# Patient Record
Sex: Male | Born: 1940 | Race: White | Hispanic: No | Marital: Married | Smoking: Never smoker
Health system: Southern US, Community
[De-identification: ages and names within clinical notes are randomized; demographics above are authoritative.]

## PROBLEM LIST (undated history)

## (undated) DIAGNOSIS — C61 Malignant neoplasm of prostate: Secondary | ICD-10-CM

## (undated) DIAGNOSIS — B029 Zoster without complications: Secondary | ICD-10-CM

## (undated) DIAGNOSIS — N419 Inflammatory disease of prostate, unspecified: Secondary | ICD-10-CM

## (undated) DIAGNOSIS — Z950 Presence of cardiac pacemaker: Secondary | ICD-10-CM

## (undated) DIAGNOSIS — G4730 Sleep apnea, unspecified: Secondary | ICD-10-CM

## (undated) DIAGNOSIS — E7800 Pure hypercholesterolemia, unspecified: Secondary | ICD-10-CM

## (undated) DIAGNOSIS — B54 Unspecified malaria: Secondary | ICD-10-CM

## (undated) DIAGNOSIS — I10 Essential (primary) hypertension: Secondary | ICD-10-CM

## (undated) HISTORY — PX: PACEMAKER INSERTION: SHX728

## (undated) HISTORY — PX: OTHER SURGICAL HISTORY: SHX169

---

## 2004-01-27 ENCOUNTER — Ambulatory Visit: Payer: Self-pay | Admitting: Family Medicine

## 2005-02-12 DIAGNOSIS — B54 Unspecified malaria: Secondary | ICD-10-CM

## 2005-02-12 HISTORY — DX: Unspecified malaria: B54

## 2005-10-15 DIAGNOSIS — Z8613 Personal history of malaria: Secondary | ICD-10-CM | POA: Insufficient documentation

## 2005-11-01 ENCOUNTER — Inpatient Hospital Stay: Payer: Self-pay | Admitting: Internal Medicine

## 2005-11-01 ENCOUNTER — Ambulatory Visit: Payer: Self-pay | Admitting: Family Medicine

## 2006-02-12 DIAGNOSIS — B029 Zoster without complications: Secondary | ICD-10-CM

## 2006-02-12 HISTORY — DX: Zoster without complications: B02.9

## 2006-05-31 ENCOUNTER — Other Ambulatory Visit: Payer: Self-pay

## 2006-06-01 ENCOUNTER — Inpatient Hospital Stay: Payer: Self-pay | Admitting: Internal Medicine

## 2007-04-08 ENCOUNTER — Ambulatory Visit: Payer: Self-pay | Admitting: Gastroenterology

## 2008-05-02 ENCOUNTER — Emergency Department: Payer: Self-pay | Admitting: Emergency Medicine

## 2010-03-15 ENCOUNTER — Ambulatory Visit: Payer: Self-pay | Admitting: Family Medicine

## 2010-09-06 ENCOUNTER — Other Ambulatory Visit: Payer: Self-pay | Admitting: Unknown Physician Specialty

## 2010-11-17 ENCOUNTER — Ambulatory Visit: Payer: Self-pay | Admitting: Unknown Physician Specialty

## 2011-03-29 DIAGNOSIS — I951 Orthostatic hypotension: Secondary | ICD-10-CM | POA: Insufficient documentation

## 2011-03-29 DIAGNOSIS — I2510 Atherosclerotic heart disease of native coronary artery without angina pectoris: Secondary | ICD-10-CM | POA: Insufficient documentation

## 2012-02-21 DIAGNOSIS — R0681 Apnea, not elsewhere classified: Secondary | ICD-10-CM | POA: Insufficient documentation

## 2013-12-28 ENCOUNTER — Ambulatory Visit: Payer: Self-pay | Admitting: Gastroenterology

## 2014-03-31 ENCOUNTER — Telehealth: Payer: Self-pay | Admitting: Medical Oncology

## 2014-03-31 NOTE — Telephone Encounter (Signed)
I called pt to introduce myself as the Prostate Nurse Navigator and the Coordinator of the Prostate [place].  1. I confirmed with the patient he is aware of his referral to the clinic. He stated that Dr. Alinda Money called him last night to discuss. We confirmed his appointment for 2/23 to arrive at 12:30pm.  2. I discussed the format of the clinic and the physicians he will be seeing that day. I encouraged him to bring his wife and any family members. He states his son is an ER physician in [place] and will be coming.  3. I discussed where the clinic is located, the [place] parking  and how to contact me.  4. I confirmed his address and informed him I would be mailing a packet of information and forms to be completed. I asked him to bring them with him the day of his appointment.   He voiced understanding of the above. I asked him to call me if he has any questions or concerns regarding his appointments or the forms he needs to complete.  Cira Rue, RN, BSN, [place]  [phone]  Fax [phone]

## 2014-04-05 ENCOUNTER — Telehealth: Payer: Self-pay | Admitting: Medical Oncology

## 2014-04-05 NOTE — Progress Notes (Addendum)
GU Location of Tumor / Histology: Prostatic adenocarcinoma  If Prostate Cancer, Gleason Score is (3 +4+7) and PSA is (10.16)  Wesley Gonzalez presented 4-8 years ago with signs/symptoms of a long history of elevated PSA.  Has undergone four prostate biopsies which have confirmed chronic inflammation but no malignancy.  He has developed post-biopsy sepsis twice.  Baseline IPSS: 15.  Reported erectile dysfunction.    Biopsies of prostate (if applicable) revealed: 03/00/9233:   Past/Anticipated interventions by urology, if any: currently on Finasteride 5mg   Past/Anticipated interventions by medical oncology, if any:   Weight changes, if any:   Bowel/Bladder complaints, if any:   Nausea/Vomiting, if any:   Pain issues, if any:    SAFETY ISSUES:  Prior radiation? no  Pacemaker/ICD? yes  Possible current pregnancy? no  Is the patient on methotrexate? no   Current Complaints / other details:

## 2014-04-05 NOTE — Telephone Encounter (Signed)
I called Mr. Smelser to confirm his appointment for [facility] 04/06/14 arriving at 12:30. He was in a meeting but I spoke with his wife. Pt did receive his packet of information and I asked her to have him bring tomorrow. We reviewed where the cancer center is located and about our valet services. I asked her to call me with any questions of concerns. She voiced understanding.    Cira Rue, RN, BSN, [place]  [phone] Fax [phone]

## 2014-04-06 ENCOUNTER — Encounter: Payer: Self-pay | Admitting: Medical Oncology

## 2014-04-06 ENCOUNTER — Ambulatory Visit (HOSPITAL_BASED_OUTPATIENT_CLINIC_OR_DEPARTMENT_OTHER): Payer: Medicare Other | Admitting: Oncology

## 2014-04-06 ENCOUNTER — Ambulatory Visit
Admission: RE | Admit: 2014-04-06 | Discharge: 2014-04-06 | Disposition: A | Discharge status: Home / Self Care | Payer: Medicare Other | Source: Ambulatory Visit | Attending: Radiation Oncology | Admitting: Radiation Oncology

## 2014-04-06 VITALS — BP 140/62 | HR 86 | Temp 97.9°F | Ht 69.0 in | Wt 220.0 lb

## 2014-04-06 DIAGNOSIS — E785 Hyperlipidemia, unspecified: Secondary | ICD-10-CM | POA: Diagnosis not present

## 2014-04-06 DIAGNOSIS — N529 Male erectile dysfunction, unspecified: Secondary | ICD-10-CM | POA: Insufficient documentation

## 2014-04-06 DIAGNOSIS — Z9049 Acquired absence of other specified parts of digestive tract: Secondary | ICD-10-CM | POA: Insufficient documentation

## 2014-04-06 DIAGNOSIS — R351 Nocturia: Secondary | ICD-10-CM

## 2014-04-06 DIAGNOSIS — Z7982 Long term (current) use of aspirin: Secondary | ICD-10-CM | POA: Insufficient documentation

## 2014-04-06 DIAGNOSIS — Z950 Presence of cardiac pacemaker: Secondary | ICD-10-CM | POA: Insufficient documentation

## 2014-04-06 DIAGNOSIS — I499 Cardiac arrhythmia, unspecified: Secondary | ICD-10-CM | POA: Diagnosis not present

## 2014-04-06 DIAGNOSIS — C61 Malignant neoplasm of prostate: Secondary | ICD-10-CM

## 2014-04-06 DIAGNOSIS — Z87438 Personal history of other diseases of male genital organs: Secondary | ICD-10-CM | POA: Insufficient documentation

## 2014-04-06 DIAGNOSIS — I10 Essential (primary) hypertension: Secondary | ICD-10-CM | POA: Insufficient documentation

## 2014-04-06 HISTORY — DX: Essential (primary) hypertension: I10

## 2014-04-06 HISTORY — DX: Pure hypercholesterolemia, unspecified: E78.00

## 2014-04-06 HISTORY — DX: Unspecified malaria: B54

## 2014-04-06 HISTORY — DX: Presence of cardiac pacemaker: Z95.0

## 2014-04-06 HISTORY — DX: Zoster without complications: B02.9

## 2014-04-06 HISTORY — DX: Inflammatory disease of prostate, unspecified: N41.9

## 2014-04-06 HISTORY — DX: Sleep apnea, unspecified: G47.30

## 2014-04-06 HISTORY — DX: Malignant neoplasm of prostate: C61

## 2014-04-06 NOTE — Addendum Note (Signed)
Encounter addended by: Raynelle Bring, MD on: 04/06/2014 10:36 PM<BR>     Documentation filed: Clinical Notes

## 2014-04-06 NOTE — Addendum Note (Signed)
Encounter addended by: Deirdre Evener, RN on: 04/06/2014  5:09 PM<BR>     Documentation filed: Charges VN

## 2014-04-06 NOTE — Addendum Note (Signed)
Encounter addended by: Gwenette Greet, RN on: 04/06/2014  5:01 PM<BR>     Documentation filed: Inpatient Document Flowsheet

## 2014-04-06 NOTE — Consult Note (Signed)
Reason for Referral: Prostate cancer  HPI: 74 year old gentleman currently of [place] where he resides. He has a history of hyperlipidemia, hypertension and cardiac pacemaker. He had a long history of elevated PSA for the last 10 years. He has had multiple biopsies in the past that were done revealing. His most recent PSA was up to 10.16 in October 2015. He had a repeat biopsy on 03/24/2014 which showed a prostate cancer Gleason score 3+4 = 7 in 6 cores. He does have history of BPH and has been on finasteride with an IPSS score of 15. He does report occasional nocturia but no hematuria or dysuria. Otherwise he is asymptomatic. He does have erectile dysfunction that has been chronic in nature. Otherwise he is asymptomatic and continues to be very active. He is planning a trip in the near future to [place] and continues to perform activities of daily living. He does not report any headaches or blurry vision or syncope. He does not report any chest pain or shortness of breath. He does not report any cough or hemoptysis. Rest of his review of systems unremarkable.   Past Medical History  Diagnosis Date  . Prostatitis   . Hypercholesteremia   . Hypertension   . Malaria 2007    history of  . Sleep apnea     Cpap  . Prostate cancer   . Shingles 2008  . Pacemaker   :  Past Surgical History  Procedure Laterality Date  . Pacemaker insertion    . Choleycystectomy    :   Current outpatient prescriptions:  .  aspirin 81 MG tablet, Take 81 mg by mouth daily., Disp: , Rfl:  .  finasteride (PROSCAR) 5 MG tablet, Take 5 mg by mouth daily., Disp: , Rfl:  .  losartan (COZAAR) 25 MG tablet, Take 25 mg by mouth daily., Disp: , Rfl:  .  Nutritional Supplements (JUICE PLUS FIBRE PO), Take 6 tablets by mouth daily., Disp: , Rfl:  .  Probiotic Product (SOLUBLE FIBER/PROBIOTICS PO), Take 1 tablet by mouth 1 day or 1 dose., Disp: , Rfl: :  Allergies  Allergen Reactions  . Ace Inhibitors     Fatigue and  Lightheadeness  . Other     Beta Blockers - Fatigue, Light headeness  . Statins     Fatigue and myalgia  :  Family History  Problem Relation Age of Onset  . Cancer Mother     Malignant Histocytoma  . Coronary artery disease Father   :  History   Social History  . Marital Status: Married    Spouse Name: N/A  . Number of Children: N/A  . Years of Education: N/A   Occupational History  . Minister    Social History Main Topics  . Smoking status: Never Smoker   . Smokeless tobacco: Not on file  . Alcohol Use: No  . Drug Use: No  . Sexual Activity: Not on file   Other Topics Concern  . Not on file   Social History Narrative  :  Pertinent items are noted in HPI.    Assessment and Plan:   74 year old gentleman with prostate cancer diagnosed in February 2016 after presenting with a PSA of 10.16 and a Gleason score 3+4 = 7. His case was discussed today and the prostate cancer multidisciplinary clinic. His biopsy was reviewed with the reviewing pathologist. Given his high PSA and high risk of metastatic disease, we have recommended staging workup including weight CT scan and a bone scan to  rule out metastatic disease. If he has organ confined disease, he would be an excellent candidate for external beam radiation with potentially 2 years of androgen deprivation therapy. The rationale for using androgen deprivation therapy was discussed with the patient and his family. I explained to him the duration is based on multiple clinical trials that have tested different durations ranging from 6 months to 24 months. It is reasonable to consider 16-24 months is the standard of care. Complications include fatigue, tiredness, weight gain, hot flashes, among others were discussed. I explained to him unless he has systemic disease, I see no role for systemic chemotherapy at this time. All his questions were answered to his satisfaction.

## 2014-04-06 NOTE — Consult Note (Signed)
Chief Complaint  Prostate Cancer   Reason For Visit  Reason for visit: To discuss treatment options for prostate cancer. PCP:  Dr. 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  His PMH is significant for sleep apnea, hypertension, hypercholesterolemia, and arrhythmia s/p cardiac pacemaker placement.  He has a long history of an elevated PSA and had undergone four prior prostate biopsies by Dr. Luanne Bras including biopsies in July 2005 (chronic inflammation) and July 2006 (chronic inflammation), and March 2010 (chronic inflammation).  His PSA has ranged between 4 and 8 over the years despite therapy with finasteride. I assumed his care in 2012 after Dr. Tommy Medal retirement.  His PSA fluctuated and I did perform a PCA -3 test in 2014 that was non-diagnostic.  His PSA further increased to 10.16 (free % of 5%) in October 2015.  He was unable to undergo an MRI of the prostate due to his pacemaker and therefore was recommended to undergo another prostate biopsy.  This was initially delayed due to his busy travel schedule but was finally performed on 03/23/14 and demonstrated 7 out of 12 biopsy cores positive for Gleason 3+4=7 adenocarcinoma of the prostate.   TNM stage: cT1c Nx Mx PSA: 10.16 (on 5 ARI) Gleason score: 3+4=7 Biopsy (03/23/14): 7/12 cores positive Prostate volume: 32.4 cc  Nomogram OC disease: 18% EPE: 80% SVI:9% LNI: 8% PFS (surgery): 64% at 5 years, 48% at 10 years  Urinary function: He does take finasteride.  IPSS is 12. Erectile function: SHIM score is 19.   Past Medical History  1. History of acute prostatitis (Z87.438)  2. History of cardiac pacemaker (Z95.0)  3. History of hypercholesterolemia (Z86.39)  4. History of hypertension (Z86.79)  5. History of sleep apnea (Z87.09)  6. History of Malaria  Surgical History  1. History of Cholecystectomy  2. History of Pacemaker Placement  Current Meds  1. Aspirin Low Strength 81 MG Oral Tablet Chewable;  Therapy: (Recorded:14Nov2007) to Recorded  2. Finasteride 5 MG Oral Tablet; TAKE 1 TABLET BY MOUTH ONCE A DAY;  Therapy: [id number] to (Evaluate:09May2016)  Requested for: [id number]; Last  Rx:11Nov2015 Ordered  3. Fish Oil CAPS;  Therapy: (Recorded:14Nov2007) to Recorded  4. Juice Plus Fibre Oral Liquid;  Therapy: (Recorded:08Apr2010) to Recorded  5. Levofloxacin 500 MG Oral Tablet; 1 po q day beginning the day prior to biopsy;  Therapy: [phone] to (Last Rx:19Oct2015)  Requested for: 19Oct2015 Ordered  6. Red Yeast Rice CAPS;  Therapy: (Recorded:10Oct2011) to Recorded  Allergies  1. ACE Inhibitors  2. AmLODIPine Besylate TABS  3. Cozaar TABS  4. Flomax CP24  5. Simvastatin TABS  6. Statins  7. Tricor TABS  Family History  1. Family history of Cancer : Mother  2. Family history of Coronary Artery Disease : Father  Social History   Denied: Alcohol Use   Marital History - Currently Married   Never A Smoker   Occupation:   Denied: Tobacco Use  Review of Systems AU Complete-Male: Constitutional, skin, eye, otolaryngeal, hematologic/lymphatic, cardiovascular, pulmonary, endocrine, musculoskeletal, gastrointestinal, neurological and psychiatric system(s) were reviewed and pertinent findings if present are noted and are otherwise negative.    Physical Exam Constitutional: Well nourished and well developed . No acute distress.    Results/Data  I have reviewed his pathology slides, medical records, PSA results, etc. today in the [facility].  Assessment  1. Prostate cancer (C61)  Discussion/Summary   1. High risk prostate cancer: I had a detailed discussion with Wesley Gonzalez, his wife, and his son (an ER physician) today. Wesley Gonzalez technically has high risk prostate cancer when adjusting his PSA for finasteride use that would adjust his PSA  to over 20.  I recommended he proceed with staging studies including a CT scan and bone scan for further evaluation.   The patient was counseled about the natural history of prostate cancer and the standard treatment options that are available for prostate cancer. It was explained to him how his age and life expectancy, clinical stage, Gleason score, and PSA affect his prognosis, the decision to proceed with additional staging studies, as well as how that information influences recommended treatment strategies. We discussed the roles for active surveillance, radiation therapy, surgical therapy, androgen deprivation, as well as ablative therapy options for the treatment of prostate cancer as appropriate to his individual cancer situation. We discussed the risks and benefits of these options with regard to their impact on cancer control and also in terms of potential adverse events, complications, and impact on quiality of life particularly related to urinary, bowel, and sexual function. The patient was encouraged to ask questions throughout the discussion today and all questions were answered to his stated satisfaction. In addition, the patient was provided with and/or directed to appropriate resources and literature for further education about prostate cancer and treatment options.   Pending his staging studies, we discussed options for treatment assuming he does not have metastatic disease.  Considering his age, life expectancy, and medical comorbidities, I recommended that he strongly consider long term ADT and EBRT.  He will see Dr. Alen Blew and Dr. Valere Dross later today to discuss his options. We discussed the reason for this recommendation and he agrees.  He is going to consider optimal timing of therapy including starting ADT and when he can block out 8 weeks for radiation.  He likely will be best served by 8 weeks of EBRT due to his voiding symptoms rather than 5 weeks of EBRT + brachytherapy.   I will order  his staging studies and he plans to notify me when he wishes to start ADT and when he would be available to start radiation therapy.  We reviewed the potential side effects of ADT today and he has a very good understanding.  cc: Dr. Zola Button Dr. Arloa Koh Dr. Ashok Norris  A total of 65 minutes were spent in the overall care of the patient today with 65 minutes in direct face to face consultation.    Signatures Electronically signed by : Raynelle Bring, M.D.; Apr 06 2014 10:34PM EST

## 2014-04-06 NOTE — [facility] Oncology Navigator Note (Signed)
                               Care Plan Summary  Name: Mr. Wesley Gonzalez DOB: [birth year]   Your Medical Team:   Urologist -  Dr. Raynelle Bring, [facility]  Radiation Oncologist - Dr. Arloa Koh, [facility]   Medical Oncologist - Dr. Zola Button, [place]  Recommendations: 1) Staging studies- CT and bone scan- Dr. Lynne Logan office will schedule  2) ADT-Hormone Therapy Dr. Lynne Logan office will schedule 3) Contact Dr. Lynne Logan office for referral  * These recommendations are based on information available as of today's consult.      Recommendations may change depending on the results of further tests or exams.    Next Steps: 1) Dr. Lynne Logan office will schedule  2) Dr. Lynne Logan office will schedule ADT 3) Contact Dr. Alinda Money when ready to start radiation  When appointments need to be scheduled, you will be contacted by [facility] and/or [facility].  Questions?  Please do not hesitate to call Cira Rue, RN, BSN, CRNI at [phone] any questions or concerns.  Shirlean Mylar is your Oncology Nurse Navigator and is available to assist you while you're receiving your medical care at [facility].

## 2014-04-06 NOTE — Progress Notes (Signed)
[facility] NEW PATIENT EVALUATION  Name: Wesley Gonzalez MRN: [id number]  Date:   04/06/2014           DOB: 11-15-1940  Status: outpatient   CC:   Raynelle Bring, MD, Dr. 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 male who is a pleasant 74 year old male who is seen today through the courtesy of Dr. Alinda Money at the prostate multidisciplinary clinic for evaluation of his stage T1c high-risk adenocarcinoma prostate.  I understand that the patient has a history of fluctuating PSA determinations between 4 and 8 over many years.  Biopsies back in July 2005 and 2006 showed chronic inflammation.  A biopsy in March 2010 again showed chronic inflammation with a volume of 36 mL.  Most recently, his PSA rose to 10.16 on 11/25/2013 while on finasteride.  Repeat biopsies on 03/23/2014 by Dr. Alinda Money revealed Gleason 7 (3+4) involving 50% of one core from the right lateral apex, 30% of one core from the right apex, 20% of one core from left lateral mid gland, 10% of one core from the left mid gland, 20% of one core from left lateral apex and 60% of one core from left apex.  He also had Gleason 6 (3+3) involving 60% of one core from the right mid gland.  His gland volume was approximately 32 mL.  He is doing reasonably well from a GU and GI standpoint.  His I PSS score is 12.  He does have a history of erectile dysfunction (SHIM score 19).  PREVIOUS RADIATION THERAPY: No   PAST MEDICAL HISTORY:  has a past medical history of Prostatitis; Hypercholesteremia; Hypertension; Malaria (2007); Sleep apnea; Prostate cancer; Shingles (2008); and Pacemaker.     PAST SURGICAL HISTORY:  Past Surgical History  Procedure Laterality Date  . Pacemaker insertion    . Choleycystectomy       FAMILY HISTORY: family history includes Cancer in his mother;  Coronary artery disease in his father. His father died from cardiac disease at 68.  His mother died from "malignant histiocytoma" at age 43.  No family history of prostate cancer.    SOCIAL HISTORY:  reports that he has never smoked. He does not have any smokeless tobacco history on file. He reports that he does not drink alcohol or use illicit drugs.  He has performed [place] work most of his life traveling to [place], and [place].  He remains active.  He has 4 children.  A son is an ER physician in [place].     ALLERGIES: Ace inhibitors; Other; and Statins   MEDICATIONS:  Current Outpatient Prescriptions  Medication Sig Dispense Refill  . aspirin 81 MG tablet Take 81 mg by mouth daily.    . finasteride (PROSCAR) 5 MG tablet Take 5 mg by mouth daily.    Marland Kitchen losartan (COZAAR) 25 MG tablet Take 25 mg by mouth daily.    . Nutritional Supplements (JUICE PLUS FIBRE PO) Take 6 tablets by mouth daily.    . Probiotic Product (SOLUBLE FIBER/PROBIOTICS PO) Take 1 tablet by mouth 1 day or 1 dose.     No current facility-administered medications for this encounter.     REVIEW OF SYSTEMS:  Pertinent items are noted in HPI.    PHYSICAL EXAM:  height is 5\' 9"  (1.753 m) and weight is 220 lb (99.791 kg). His  temperature is 97.9 F (36.6 C). His blood pressure is 140/62 and his pulse is 86.   Rectal examination not performed today.     LABORATORY DATA:  No results found for: WBC, HGB, HCT, MCV, PLT No results found for: NA, K, CL, CO2 No results found for: ALT, AST, GGT, ALKPHOS, BILITOT   PSA 10.16 (corrected 20.32) from 11/25/2013   IMPRESSION: Stage TIc high-risk adenocarcinoma prostate.  I explained to his family, including his son who is a physician, that his prognosis is related to his stage, PSA level, and Gleason score.  His corrected PSA is over 20 is unfavorable.  His Gleason score of 7 is of intermediate favorability.  He does have high volume disease.  He has high-risk  disease.  He will need a staging workup with a CT scan and bone scan, and this will be arranged by Dr. Alinda Money.  We discussed surgery versus androgen deprivation therapy alone versus radiation therapy along with androgen deprivation therapy.  Radiation therapy options include external beam followed by seed implantation boost or external beam/IMRT.  After lengthy discussion he is most interested in external beam/IMRT.  We also discussed androgen deprivation therapy in detail, ideally for 2 years.  We talked about placement of 3 gold seed markers for image guidance.  We discussed the potential acute and late toxicities of radiation therapy and also androgen deprivation therapy.  We discussed the timing of androgen deprivation therapy and the start of radiation therapy 2-1/2 to 3 months later.  He can certainly  be treated closer to home in [place], and he can be referred to Dr. Noreene Filbert after his staging workup.  Plan: As discussed above.     30 minutes face to face with the patient and more than 50% of that time was spent in counseling and/or coordination of care.

## 2014-04-06 NOTE — Progress Notes (Signed)
Please see consult note.  

## 2014-04-08 ENCOUNTER — Telehealth: Payer: Self-pay | Admitting: Medical Oncology

## 2014-04-08 NOTE — Telephone Encounter (Signed)
Wesley Gonzalez called stating that he and his family further discussed his treatment after the Prostate [place]. 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 He would like to get his CT and bone scan scheduled as soon as possible for staging and if all is well start ADT and radiation. I informed him that I will contact Dr. Alinda Money and give him this information. I will follow up with pt once I hear back from Dr. Alinda Money. He voiced understanding.

## 2014-04-08 NOTE — Telephone Encounter (Signed)
I left a message that Dr. Alinda Money did contact me and he would prefer that he have his scans here in [place] so he can review them. He is going to get his office to schedule these on the same day to make it more convenient. Dr. Lynne Logan office should get this scheduled today. I asked them to call me with any other questions or concerns.

## 2014-04-09 ENCOUNTER — Telehealth: Payer: Self-pay | Admitting: Medical Oncology

## 2014-04-09 ENCOUNTER — Encounter: Payer: Self-pay | Admitting: *Deleted

## 2014-04-09 NOTE — Telephone Encounter (Signed)
I called Mr. Meddaugh to see if has heard from Dr. Lynne Logan office regarding scans. He states he has not. I called Dr. Lynne Logan office and spoke with Rebekah Chesterfield who informed me to Helene Kelp the scheduler is on vacation this week. She will return on Monday. She did give me Teresa's ext (# [phone] ) and asked that the patient call her Monday. I called pt and spoke with his wife Fraser Din to give her this information. I will also follow up [facility] on Monday and will call pt. She voiced understanding.

## 2014-04-09 NOTE — Progress Notes (Signed)
Prostate Multidisciplinary Clinic  Clinical Social Work  Clinical Social Work met with patient/family prostate multidisciplinary clinic to offer support and assess for psychosocial needs. The patient scored a 0 on the Psychosocial Distress Thermometer which indicates no distress. Mr. Ohms shared he has no distress and no concerns at this time.  He was accompanied by his spouse and son.  His spouse shared he is a very positive person and it is not uncommon for him to have no concerns.    ONCBCN DISTRESS SCREENING 04/06/2014  Screening Type Initial Screening  Distress experienced in past week (1-10) 0                          Clinical Social Work briefly discussed [place] Work role and [employer] support programs/services. Clinical Social Work encouraged patient to call with any additional questions or concerns.  Polo Riley, MSW, LCSW, OSW-C  Clinical Social Worker  [facility]  [phone]

## 2014-04-13 ENCOUNTER — Other Ambulatory Visit (HOSPITAL_COMMUNITY): Payer: Self-pay | Admitting: Urology

## 2014-04-13 ENCOUNTER — Telehealth: Payer: Self-pay | Admitting: Medical Oncology

## 2014-04-13 DIAGNOSIS — C61 Malignant neoplasm of prostate: Secondary | ICD-10-CM

## 2014-04-13 NOTE — Telephone Encounter (Signed)
I called Mr. Wesley Gonzalez to confirm he received a call regarding appointments for his CT and bone scan that are scheduled for 3/11.He states that Helene Kelp from [facility] had just called him with the dates and time. He is willing to come in earlier if there is a cancellation. This message was left for Wesley Gonzalez.

## 2014-04-15 ENCOUNTER — Telehealth: Payer: Self-pay | Admitting: Medical Oncology

## 2014-04-15 NOTE — Telephone Encounter (Signed)
Pt's wife Fraser Din called to confirm location of scans. She states she has the times but she did not write down where they would be done. I informed her that the CT will be done at [facility] and the bone scan injection and scan will be done here at [facility]. She voiced understanding.

## 2014-04-19 ENCOUNTER — Telehealth: Payer: Self-pay | Admitting: Medical Oncology

## 2014-04-19 NOTE — Telephone Encounter (Signed)
Mr. Provencal called asking that he be called if he can possibly get his CT and bone scan done earlier. He also stated he was not aware that Dr. Alinda Money had prescribed him casodex until he picked up some medications from the pharmacy. He read the drug information sheet and he is very concerned about the side effects. We discussed how the drug works and it's purpose. I explained to him that he need to discuss further with Dr. Alinda Money and I will notify him of his concerns. He voiced understanding and I left a message with Dr. Alinda Money.   Cira Rue, RN, BSN, [place]  [phone]  Fax [phone]

## 2014-04-23 ENCOUNTER — Encounter (HOSPITAL_COMMUNITY)
Admission: RE | Admit: 2014-04-23 | Discharge: 2014-04-23 | Disposition: A | Discharge status: Home / Self Care | Payer: Medicare Other | Source: Ambulatory Visit | Attending: Urology | Admitting: Urology

## 2014-04-23 DIAGNOSIS — C61 Malignant neoplasm of prostate: Secondary | ICD-10-CM | POA: Diagnosis not present

## 2014-04-23 MED ORDER — TECHNETIUM TC 99M MEDRONATE IV KIT: 26.6000 | PACK | Freq: Once | INTRAVENOUS | Status: AC | PRN

## 2014-05-03 ENCOUNTER — Ambulatory Visit
Admit: 2014-05-03 | Disposition: A | Discharge status: Home / Self Care | Payer: Self-pay | Attending: Radiation Oncology | Admitting: Radiation Oncology

## 2014-05-14 ENCOUNTER — Ambulatory Visit
Admit: 2014-05-14 | Disposition: A | Discharge status: Home / Self Care | Payer: Self-pay | Attending: Radiation Oncology | Admitting: Radiation Oncology

## 2014-06-07 ENCOUNTER — Other Ambulatory Visit: Payer: Self-pay | Admitting: *Deleted

## 2014-06-11 ENCOUNTER — Other Ambulatory Visit: Payer: Self-pay | Admitting: *Deleted

## 2014-06-11 ENCOUNTER — Ambulatory Visit: Admission: RE | Admit: 2014-06-11 | Payer: Medicare Other | Source: Ambulatory Visit

## 2014-06-11 DIAGNOSIS — C61 Malignant neoplasm of prostate: Secondary | ICD-10-CM

## 2014-06-13 NOTE — Consult Note (Signed)
Reason for Visit: This 74 year old Male patient presents to the clinic for initial evaluation of  prostate cancer .   Referred by Dr. Raynelle Bring.  Diagnosis:  Chief Complaint/Diagnosis   74 year old male with stage IIa (T1 cN0 M0) Gleason 7 (3+4) presenting with a PSA of 10.1. For image guided radiation therapy  Pathology Report pathology report reviewed   Imaging Report CT scan and bone scan reviewed   Referral Report clinical notes reviewed   Planned Treatment Regimen image guided IM RT radiation therapy   HPI   patient is a pleasant 74 year old male who has been noted to have an elevated PSA for the past 10 years having a negative biopsy back in 2005. His most recent PA say was up to 10.1. He again underwent transrectal ultrasound-guided biopsy positive for 712 core biopsies with mostly Gleason 7 (3+4) adenocarcinoma. Patient has been on finasteride. Patient also has history of erectile dysfunction. 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 He is scheduled to have gold fiduciary markers placed in his prostate for image guided treatment he is seen today for consideration of image guided radiation therapy. Patient has had a CT scan showing enlarged prostate no evidence of lymph node involvement or extracapsular spread. Bone scan was also reported to be within normal limits.  Past Hx:    Hypercholesterolemia:    Malaria:    Sleep Apnea:    HTN:    Shingles:    BPH - Benign Prostatic Hypertrophy:    Hyperlipidemia:    HTN:    Cholecystectomy:    Pacemaker:   Past, Family and Social History:  Past Medical History positive   Cardiovascular hyperlipidemia; hypertension; pacemaker   Respiratory sleep apnea   Past Surgical History cholecystectomy   Past Medical History Comments shingles   Family History positive   Family History Comments mother with cancer of unknown type father with  cardiovascular heart disease   Social History noncontributory   Additional Past Medical and Surgical History 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   Allergies:   Ace Inhibitors: Dizzy/Fainting  Beta Blockers: Dizzy/Fainting  Amlodipine: Dizzy/Fainting  Statins: Other  Flomax: Unknown  Home Meds:  Home Medications: Medication Instructions Status  ASPIRIN 81MG  DAILY  Active  Fish Oil oral capsule 1  orally once a day  Active  finasteride 5 mg oral tablet 1  orally once a day  Active  juice plus fiber oral liquid  Active  Probiotic Formula - oral capsule 1 cap(s) orally once a day Active  Cozaar 25 mg oral tablet 1 tab(s) orally once a day Active  meloxicam 5 mg oral capsule 1 cap(s) orally once a day Active   Review of Systems:  General negative   Performance Status (ECOG) 0   Skin negative   Breast negative   Ophthalmologic negative   ENMT negative   Respiratory and Thorax negative   Cardiovascular negative   Gastrointestinal negative   Genitourinary negative   Musculoskeletal negative   Neurological negative   Psychiatric negative   Hematology/Lymphatics negative   Endocrine negative   Allergic/Immunologic negative   Review of Systems   denies any weight loss, fatigue, weakness, fever, chills or night sweats. Patient denies any loss of vision, blurred vision. Patient denies any ringing  of the ears or hearing loss. No irregular heartbeat. Patient denies heart murmur or history of fainting. Patient denies any chest pain or pain radiating to her  upper extremities. Patient denies any shortness of breath, difficulty breathing at night, cough or hemoptysis. Patient denies any swelling in the lower legs. Patient denies any nausea vomiting, vomiting of blood, or coffee ground material in the vomitus. Patient denies any stomach pain. Patient states has had normal bowel movements no significant constipation or diarrhea. Patient denies any dysuria,  hematuria or significant nocturia. Patient denies any problems walking, swelling in the joints or loss of balance. Patient denies any skin changes, loss of hair or loss of weight. Patient denies any excessive worrying or anxiety or significant depression. Patient denies any problems with insomnia. Patient denies excessive thirst, polyuria, polydipsia. Patient denies any swollen glands, patient denies easy bruising or easy bleeding. Patient denies any recent infections, allergies or URI. Patient "s visual fields have not changed significantly in recent time.   Nursing Notes:  Nursing Vital Signs and Chemo Nursing Nursing Notes: *CC Vital Signs Flowsheet:   21-Mar-16 11:27  Temp Temperature 94.2  Pulse Pulse 65  Respirations Respirations 18  SBP SBP 152  DBP DBP 87  Pain Scale (0-10)  0  Current Weight (kg) (kg) 100.6   Physical Exam:  General/Skin/HEENT:  General normal   Skin normal   Eyes normal   ENMT normal   Head and Neck normal   Additional PE well-developed well-nourished male in NAD. Lungs are clear to A&P cardiac examination shows regular rate and rhythm. On rectal exam rectal sphincter tone is good prostate is smooth without evidence of nodularity mass. Sulcus is preserved bilaterally. No other rectal abnormality is identified.   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 I would plan on delivering 8000 cGy over 8 weeks using IM RT treatment planning and delivery. Patient has been started on androgen  deprivation therapy. I would consider this patient intermediate risk prostate cancer based on his Gleason score and PSA. There is controversy about extended use of androgen deprivation therapy and these pins although I think probably 6 months would be reasonable especially considering the patient's cardiovascular history and pacemaker placement. I have set up and ordered CT simulation after placement of gold fiduciary markers. Risks and benefits of treatment including increased lower urinary tract symptoms, diarrhea, fatigue alteration of blood counts all were explained in detail to the patient and his wife. Both seem to comprehend my treatment plan well.  I would like to take this opportunity for allowing me to participate in the care of your patient..  Fax to Physician:  Physicians To Recieve Fax: Ashok Norris, MD - [phone].  Electronic Signatures: Armstead Peaks (MD)  (Signed 21-Mar-16 12:34)  Authored: HPI, Diagnosis, Past Hx, PFSH, Allergies, Home Meds, ROS, Nursing Notes, Physical Exam, Relevent Results, Encounter Assessment and Plan, Fax to Physician   Last Updated: 21-Mar-16 12:34 by Armstead Peaks (MD)

## 2014-06-14 ENCOUNTER — Ambulatory Visit
Admission: RE | Admit: 2014-06-14 | Discharge: 2014-06-14 | Disposition: A | Discharge status: Home / Self Care | Payer: Medicare Other | Source: Ambulatory Visit | Attending: Radiation Oncology | Admitting: Radiation Oncology

## 2014-06-14 ENCOUNTER — Ambulatory Visit: Payer: Medicare Other | Admitting: Radiation Oncology

## 2014-06-14 DIAGNOSIS — Z510 Encounter for antineoplastic radiation therapy: Secondary | ICD-10-CM | POA: Diagnosis not present

## 2014-06-14 DIAGNOSIS — C61 Malignant neoplasm of prostate: Secondary | ICD-10-CM | POA: Diagnosis not present

## 2014-06-15 ENCOUNTER — Ambulatory Visit
Admission: RE | Admit: 2014-06-15 | Discharge: 2014-06-15 | Disposition: A | Discharge status: Home / Self Care | Payer: Medicare Other | Source: Ambulatory Visit | Attending: Radiation Oncology | Admitting: Radiation Oncology

## 2014-06-15 DIAGNOSIS — Z510 Encounter for antineoplastic radiation therapy: Secondary | ICD-10-CM | POA: Diagnosis not present

## 2014-06-16 ENCOUNTER — Ambulatory Visit
Admission: RE | Admit: 2014-06-16 | Discharge: 2014-06-16 | Disposition: A | Discharge status: Home / Self Care | Payer: Medicare Other | Source: Ambulatory Visit | Attending: Radiation Oncology | Admitting: Radiation Oncology

## 2014-06-16 DIAGNOSIS — Z510 Encounter for antineoplastic radiation therapy: Secondary | ICD-10-CM | POA: Diagnosis not present

## 2014-06-17 ENCOUNTER — Ambulatory Visit
Admission: RE | Admit: 2014-06-17 | Discharge: 2014-06-17 | Disposition: A | Discharge status: Home / Self Care | Payer: Medicare Other | Source: Ambulatory Visit | Attending: Radiation Oncology | Admitting: Radiation Oncology

## 2014-06-17 DIAGNOSIS — Z510 Encounter for antineoplastic radiation therapy: Secondary | ICD-10-CM | POA: Diagnosis not present

## 2014-06-18 ENCOUNTER — Ambulatory Visit
Admission: RE | Admit: 2014-06-18 | Discharge: 2014-06-18 | Disposition: A | Discharge status: Home / Self Care | Payer: Medicare Other | Source: Ambulatory Visit | Attending: Radiation Oncology | Admitting: Radiation Oncology

## 2014-06-18 DIAGNOSIS — Z510 Encounter for antineoplastic radiation therapy: Secondary | ICD-10-CM | POA: Diagnosis not present

## 2014-06-21 ENCOUNTER — Ambulatory Visit
Admission: RE | Admit: 2014-06-21 | Discharge: 2014-06-21 | Disposition: A | Discharge status: Home / Self Care | Payer: Medicare Other | Source: Ambulatory Visit | Attending: Radiation Oncology | Admitting: Radiation Oncology

## 2014-06-21 DIAGNOSIS — Z510 Encounter for antineoplastic radiation therapy: Secondary | ICD-10-CM | POA: Diagnosis not present

## 2014-06-22 ENCOUNTER — Ambulatory Visit
Admission: RE | Admit: 2014-06-22 | Discharge: 2014-06-22 | Disposition: A | Discharge status: Home / Self Care | Payer: Medicare Other | Source: Ambulatory Visit | Attending: Radiation Oncology | Admitting: Radiation Oncology

## 2014-06-22 DIAGNOSIS — Z510 Encounter for antineoplastic radiation therapy: Secondary | ICD-10-CM | POA: Diagnosis not present

## 2014-06-23 ENCOUNTER — Inpatient Hospital Stay: Payer: Medicare Other

## 2014-06-23 ENCOUNTER — Ambulatory Visit
Admission: RE | Admit: 2014-06-23 | Discharge: 2014-06-23 | Disposition: A | Discharge status: Home / Self Care | Payer: Medicare Other | Source: Ambulatory Visit | Attending: Radiation Oncology | Admitting: Radiation Oncology

## 2014-06-23 DIAGNOSIS — C61 Malignant neoplasm of prostate: Secondary | ICD-10-CM

## 2014-06-23 DIAGNOSIS — Z510 Encounter for antineoplastic radiation therapy: Secondary | ICD-10-CM | POA: Diagnosis not present

## 2014-06-23 LAB — CBC
HEMATOCRIT: 42.3 % (ref 40.0–52.0)
Hemoglobin: 14.2 g/dL (ref 13.0–18.0)
MCH: 31 pg (ref 26.0–34.0)
MCHC: 33.7 g/dL (ref 32.0–36.0)
MCV: 92.1 fL (ref 80.0–100.0)
Platelets: 143 10*3/uL — ABNORMAL LOW (ref 150–440)
RBC: 4.59 MIL/uL (ref 4.40–5.90)
RDW: 13.5 % (ref 11.5–14.5)
WBC: 4.5 10*3/uL (ref 3.8–10.6)

## 2014-06-24 ENCOUNTER — Ambulatory Visit
Admission: RE | Admit: 2014-06-24 | Discharge: 2014-06-24 | Disposition: A | Discharge status: Home / Self Care | Payer: Medicare Other | Source: Ambulatory Visit | Attending: Radiation Oncology | Admitting: Radiation Oncology

## 2014-06-24 DIAGNOSIS — Z510 Encounter for antineoplastic radiation therapy: Secondary | ICD-10-CM | POA: Diagnosis not present

## 2014-06-25 ENCOUNTER — Ambulatory Visit
Admission: RE | Admit: 2014-06-25 | Discharge: 2014-06-25 | Disposition: A | Discharge status: Home / Self Care | Payer: Medicare Other | Source: Ambulatory Visit | Attending: Radiation Oncology | Admitting: Radiation Oncology

## 2014-06-25 DIAGNOSIS — Z510 Encounter for antineoplastic radiation therapy: Secondary | ICD-10-CM | POA: Diagnosis not present

## 2014-06-28 ENCOUNTER — Ambulatory Visit
Admission: RE | Admit: 2014-06-28 | Discharge: 2014-06-28 | Disposition: A | Discharge status: Home / Self Care | Payer: Medicare Other | Source: Ambulatory Visit | Attending: Radiation Oncology | Admitting: Radiation Oncology

## 2014-06-28 DIAGNOSIS — Z510 Encounter for antineoplastic radiation therapy: Secondary | ICD-10-CM | POA: Diagnosis not present

## 2014-06-29 ENCOUNTER — Ambulatory Visit
Admission: RE | Admit: 2014-06-29 | Discharge: 2014-06-29 | Disposition: A | Discharge status: Home / Self Care | Payer: Medicare Other | Source: Ambulatory Visit | Attending: Radiation Oncology | Admitting: Radiation Oncology

## 2014-06-29 ENCOUNTER — Other Ambulatory Visit: Payer: Self-pay | Admitting: *Deleted

## 2014-06-29 DIAGNOSIS — C61 Malignant neoplasm of prostate: Secondary | ICD-10-CM

## 2014-06-29 DIAGNOSIS — Z510 Encounter for antineoplastic radiation therapy: Secondary | ICD-10-CM | POA: Diagnosis not present

## 2014-06-29 MED ORDER — TAMSULOSIN HCL 0.4 MG PO CAPS: 0.4000 mg | ORAL_CAPSULE | Freq: Every day | ORAL | Status: DC

## 2014-06-30 ENCOUNTER — Ambulatory Visit
Admission: RE | Admit: 2014-06-30 | Discharge: 2014-06-30 | Disposition: A | Discharge status: Home / Self Care | Payer: Medicare Other | Source: Ambulatory Visit

## 2014-07-01 ENCOUNTER — Ambulatory Visit
Admission: RE | Admit: 2014-07-01 | Discharge: 2014-07-01 | Disposition: A | Discharge status: Home / Self Care | Payer: Medicare Other | Source: Ambulatory Visit | Attending: Radiation Oncology | Admitting: Radiation Oncology

## 2014-07-01 DIAGNOSIS — Z510 Encounter for antineoplastic radiation therapy: Secondary | ICD-10-CM | POA: Diagnosis not present

## 2014-07-02 ENCOUNTER — Ambulatory Visit
Admission: RE | Admit: 2014-07-02 | Discharge: 2014-07-02 | Disposition: A | Discharge status: Home / Self Care | Payer: Medicare Other | Source: Ambulatory Visit

## 2014-07-02 DIAGNOSIS — Z510 Encounter for antineoplastic radiation therapy: Secondary | ICD-10-CM | POA: Diagnosis not present

## 2014-07-05 ENCOUNTER — Ambulatory Visit
Admission: RE | Admit: 2014-07-05 | Discharge: 2014-07-05 | Disposition: A | Discharge status: Home / Self Care | Payer: Medicare Other | Source: Ambulatory Visit | Attending: Radiation Oncology | Admitting: Radiation Oncology

## 2014-07-05 DIAGNOSIS — Z510 Encounter for antineoplastic radiation therapy: Secondary | ICD-10-CM | POA: Diagnosis not present

## 2014-07-06 ENCOUNTER — Ambulatory Visit
Admission: RE | Admit: 2014-07-06 | Discharge: 2014-07-06 | Disposition: A | Discharge status: Home / Self Care | Payer: Medicare Other | Source: Ambulatory Visit | Attending: Radiation Oncology | Admitting: Radiation Oncology

## 2014-07-06 DIAGNOSIS — Z510 Encounter for antineoplastic radiation therapy: Secondary | ICD-10-CM | POA: Diagnosis not present

## 2014-07-07 ENCOUNTER — Inpatient Hospital Stay: Payer: Medicare Other

## 2014-07-07 ENCOUNTER — Ambulatory Visit
Admission: RE | Admit: 2014-07-07 | Discharge: 2014-07-07 | Disposition: A | Discharge status: Home / Self Care | Payer: Medicare Other | Source: Ambulatory Visit | Attending: Radiation Oncology | Admitting: Radiation Oncology

## 2014-07-07 DIAGNOSIS — Z510 Encounter for antineoplastic radiation therapy: Secondary | ICD-10-CM | POA: Diagnosis not present

## 2014-07-07 DIAGNOSIS — C61 Malignant neoplasm of prostate: Secondary | ICD-10-CM

## 2014-07-07 LAB — CBC
HCT: 43.8 % (ref 40.0–52.0)
HEMOGLOBIN: 14.5 g/dL (ref 13.0–18.0)
MCH: 30.6 pg (ref 26.0–34.0)
MCHC: 33.2 g/dL (ref 32.0–36.0)
MCV: 92.3 fL (ref 80.0–100.0)
Platelets: 130 10*3/uL — ABNORMAL LOW (ref 150–440)
RBC: 4.75 MIL/uL (ref 4.40–5.90)
RDW: 14.1 % (ref 11.5–14.5)
WBC: 4.6 10*3/uL (ref 3.8–10.6)

## 2014-07-08 ENCOUNTER — Ambulatory Visit
Admission: RE | Admit: 2014-07-08 | Discharge: 2014-07-08 | Disposition: A | Discharge status: Home / Self Care | Payer: Medicare Other | Source: Ambulatory Visit | Attending: Radiation Oncology | Admitting: Radiation Oncology

## 2014-07-08 DIAGNOSIS — Z510 Encounter for antineoplastic radiation therapy: Secondary | ICD-10-CM | POA: Diagnosis not present

## 2014-07-09 ENCOUNTER — Ambulatory Visit
Admission: RE | Admit: 2014-07-09 | Discharge: 2014-07-09 | Disposition: A | Discharge status: Home / Self Care | Payer: Medicare Other | Source: Ambulatory Visit | Attending: Radiation Oncology | Admitting: Radiation Oncology

## 2014-07-09 DIAGNOSIS — Z510 Encounter for antineoplastic radiation therapy: Secondary | ICD-10-CM | POA: Diagnosis not present

## 2014-07-13 ENCOUNTER — Ambulatory Visit: Payer: Medicare Other

## 2014-07-13 ENCOUNTER — Ambulatory Visit
Admission: RE | Admit: 2014-07-13 | Discharge: 2014-07-13 | Disposition: A | Discharge status: Home / Self Care | Payer: Medicare Other | Source: Ambulatory Visit | Attending: Radiation Oncology | Admitting: Radiation Oncology

## 2014-07-14 ENCOUNTER — Ambulatory Visit: Payer: Medicare Other

## 2014-07-14 ENCOUNTER — Ambulatory Visit
Admission: RE | Admit: 2014-07-14 | Discharge: 2014-07-14 | Disposition: A | Discharge status: Home / Self Care | Payer: Medicare Other | Source: Ambulatory Visit | Attending: Radiation Oncology | Admitting: Radiation Oncology

## 2014-07-15 ENCOUNTER — Ambulatory Visit
Admission: RE | Admit: 2014-07-15 | Discharge: 2014-07-15 | Disposition: A | Discharge status: Home / Self Care | Payer: Medicare Other | Source: Ambulatory Visit | Attending: Radiation Oncology | Admitting: Radiation Oncology

## 2014-07-15 DIAGNOSIS — Z510 Encounter for antineoplastic radiation therapy: Secondary | ICD-10-CM | POA: Diagnosis not present

## 2014-07-16 ENCOUNTER — Ambulatory Visit
Admission: RE | Admit: 2014-07-16 | Discharge: 2014-07-16 | Disposition: A | Discharge status: Home / Self Care | Payer: Medicare Other | Source: Ambulatory Visit | Attending: Radiation Oncology | Admitting: Radiation Oncology

## 2014-07-16 DIAGNOSIS — Z510 Encounter for antineoplastic radiation therapy: Secondary | ICD-10-CM | POA: Diagnosis not present

## 2014-07-19 ENCOUNTER — Ambulatory Visit
Admission: RE | Admit: 2014-07-19 | Discharge: 2014-07-19 | Disposition: A | Discharge status: Home / Self Care | Payer: Medicare Other | Source: Ambulatory Visit | Attending: Radiation Oncology | Admitting: Radiation Oncology

## 2014-07-19 ENCOUNTER — Ambulatory Visit: Payer: Medicare Other

## 2014-07-19 DIAGNOSIS — Z510 Encounter for antineoplastic radiation therapy: Secondary | ICD-10-CM | POA: Diagnosis not present

## 2014-07-20 ENCOUNTER — Ambulatory Visit
Admission: RE | Admit: 2014-07-20 | Discharge: 2014-07-20 | Disposition: A | Discharge status: Home / Self Care | Payer: Medicare Other | Source: Ambulatory Visit | Attending: Radiation Oncology | Admitting: Radiation Oncology

## 2014-07-20 DIAGNOSIS — Z510 Encounter for antineoplastic radiation therapy: Secondary | ICD-10-CM | POA: Diagnosis not present

## 2014-07-21 ENCOUNTER — Inpatient Hospital Stay: Payer: Medicare Other | Attending: Radiation Oncology

## 2014-07-21 ENCOUNTER — Ambulatory Visit
Admission: RE | Admit: 2014-07-21 | Discharge: 2014-07-21 | Disposition: A | Discharge status: Home / Self Care | Payer: Medicare Other | Source: Ambulatory Visit | Attending: Radiation Oncology | Admitting: Radiation Oncology

## 2014-07-21 DIAGNOSIS — C61 Malignant neoplasm of prostate: Secondary | ICD-10-CM | POA: Insufficient documentation

## 2014-07-21 DIAGNOSIS — Z510 Encounter for antineoplastic radiation therapy: Secondary | ICD-10-CM | POA: Diagnosis not present

## 2014-07-21 LAB — CBC
HCT: 46.3 % (ref 40.0–52.0)
Hemoglobin: 15.4 g/dL (ref 13.0–18.0)
MCH: 30.9 pg (ref 26.0–34.0)
MCHC: 33.2 g/dL (ref 32.0–36.0)
MCV: 93.2 fL (ref 80.0–100.0)
Platelets: 139 10*3/uL — ABNORMAL LOW (ref 150–440)
RBC: 4.97 MIL/uL (ref 4.40–5.90)
RDW: 13.9 % (ref 11.5–14.5)
WBC: 4.9 10*3/uL (ref 3.8–10.6)

## 2014-07-22 ENCOUNTER — Ambulatory Visit
Admission: RE | Admit: 2014-07-22 | Discharge: 2014-07-22 | Disposition: A | Discharge status: Home / Self Care | Payer: Medicare Other | Source: Ambulatory Visit | Attending: Radiation Oncology | Admitting: Radiation Oncology

## 2014-07-22 DIAGNOSIS — Z510 Encounter for antineoplastic radiation therapy: Secondary | ICD-10-CM | POA: Diagnosis not present

## 2014-07-23 ENCOUNTER — Ambulatory Visit
Admission: RE | Admit: 2014-07-23 | Discharge: 2014-07-23 | Disposition: A | Discharge status: Home / Self Care | Payer: Medicare Other | Source: Ambulatory Visit | Attending: Radiation Oncology | Admitting: Radiation Oncology

## 2014-07-23 DIAGNOSIS — Z510 Encounter for antineoplastic radiation therapy: Secondary | ICD-10-CM | POA: Diagnosis not present

## 2014-07-26 ENCOUNTER — Ambulatory Visit
Admission: RE | Admit: 2014-07-26 | Discharge: 2014-07-26 | Disposition: A | Discharge status: Home / Self Care | Payer: Medicare Other | Source: Ambulatory Visit | Attending: Radiation Oncology | Admitting: Radiation Oncology

## 2014-07-26 DIAGNOSIS — Z510 Encounter for antineoplastic radiation therapy: Secondary | ICD-10-CM | POA: Diagnosis not present

## 2014-07-27 ENCOUNTER — Ambulatory Visit
Admission: RE | Admit: 2014-07-27 | Discharge: 2014-07-27 | Disposition: A | Discharge status: Home / Self Care | Payer: Medicare Other | Source: Ambulatory Visit | Attending: Radiation Oncology | Admitting: Radiation Oncology

## 2014-07-27 ENCOUNTER — Inpatient Hospital Stay
Admission: RE | Admit: 2014-07-27 | Discharge: 2014-07-27 | Disposition: A | Discharge status: Home / Self Care | Payer: Self-pay | Source: Ambulatory Visit | Attending: Radiation Oncology | Admitting: Radiation Oncology

## 2014-07-27 DIAGNOSIS — Z510 Encounter for antineoplastic radiation therapy: Secondary | ICD-10-CM | POA: Diagnosis not present

## 2014-07-28 ENCOUNTER — Ambulatory Visit
Admission: RE | Admit: 2014-07-28 | Discharge: 2014-07-28 | Disposition: A | Discharge status: Home / Self Care | Payer: Medicare Other | Source: Ambulatory Visit | Attending: Radiation Oncology | Admitting: Radiation Oncology

## 2014-07-28 ENCOUNTER — Telehealth: Payer: Self-pay | Admitting: Medical Oncology

## 2014-07-28 DIAGNOSIS — Z510 Encounter for antineoplastic radiation therapy: Secondary | ICD-10-CM | POA: Diagnosis not present

## 2014-07-28 NOTE — Telephone Encounter (Signed)
Oncology Nurse Navigator Documentation  Oncology Nurse Navigator Flowsheets 07/28/2014  Navigator Encounter Type Telephone- I called Mr. Wesley Gonzalez and left a message requesting a return call. I met him in the [facility]state [place] in Feb. He had his radiation treatments at [facility]. I am just following up with patient to make sure treatment went well and to see if I can be of assistance in any other way.  Time Spent with Patient 5 min

## 2014-07-29 ENCOUNTER — Ambulatory Visit
Admission: RE | Admit: 2014-07-29 | Discharge: 2014-07-29 | Disposition: A | Discharge status: Home / Self Care | Payer: Medicare Other | Source: Ambulatory Visit | Attending: Radiation Oncology | Admitting: Radiation Oncology

## 2014-07-29 DIAGNOSIS — Z510 Encounter for antineoplastic radiation therapy: Secondary | ICD-10-CM | POA: Diagnosis not present

## 2014-07-30 ENCOUNTER — Ambulatory Visit
Admission: RE | Admit: 2014-07-30 | Discharge: 2014-07-30 | Disposition: A | Discharge status: Home / Self Care | Payer: Medicare Other | Source: Ambulatory Visit | Attending: Radiation Oncology | Admitting: Radiation Oncology

## 2014-07-30 DIAGNOSIS — Z510 Encounter for antineoplastic radiation therapy: Secondary | ICD-10-CM | POA: Diagnosis not present

## 2014-08-02 ENCOUNTER — Ambulatory Visit
Admission: RE | Admit: 2014-08-02 | Discharge: 2014-08-02 | Disposition: A | Discharge status: Home / Self Care | Payer: Medicare Other | Source: Ambulatory Visit | Attending: Radiation Oncology | Admitting: Radiation Oncology

## 2014-08-02 DIAGNOSIS — Z510 Encounter for antineoplastic radiation therapy: Secondary | ICD-10-CM | POA: Diagnosis not present

## 2014-08-03 ENCOUNTER — Inpatient Hospital Stay
Admission: RE | Admit: 2014-08-03 | Discharge: 2014-08-03 | Disposition: A | Discharge status: Home / Self Care | Payer: Self-pay | Source: Ambulatory Visit | Attending: Radiation Oncology | Admitting: Radiation Oncology

## 2014-08-03 ENCOUNTER — Ambulatory Visit
Admission: RE | Admit: 2014-08-03 | Discharge: 2014-08-03 | Disposition: A | Discharge status: Home / Self Care | Payer: Medicare Other | Source: Ambulatory Visit | Attending: Radiation Oncology | Admitting: Radiation Oncology

## 2014-08-03 DIAGNOSIS — Z510 Encounter for antineoplastic radiation therapy: Secondary | ICD-10-CM | POA: Diagnosis not present

## 2014-08-04 ENCOUNTER — Ambulatory Visit
Admission: RE | Admit: 2014-08-04 | Discharge: 2014-08-04 | Disposition: A | Discharge status: Home / Self Care | Payer: Medicare Other | Source: Ambulatory Visit | Attending: Radiation Oncology | Admitting: Radiation Oncology

## 2014-08-04 DIAGNOSIS — Z510 Encounter for antineoplastic radiation therapy: Secondary | ICD-10-CM | POA: Diagnosis not present

## 2014-08-05 ENCOUNTER — Ambulatory Visit
Admission: RE | Admit: 2014-08-05 | Discharge: 2014-08-05 | Disposition: A | Discharge status: Home / Self Care | Payer: Medicare Other | Source: Ambulatory Visit | Attending: Radiation Oncology | Admitting: Radiation Oncology

## 2014-08-05 DIAGNOSIS — Z510 Encounter for antineoplastic radiation therapy: Secondary | ICD-10-CM | POA: Diagnosis not present

## 2014-09-15 ENCOUNTER — Ambulatory Visit: Payer: Medicare Other | Admitting: Radiation Oncology

## 2014-09-27 ENCOUNTER — Ambulatory Visit
Admission: RE | Admit: 2014-09-27 | Discharge: 2014-09-27 | Disposition: A | Discharge status: Home / Self Care | Payer: Medicare Other | Source: Ambulatory Visit | Attending: Radiation Oncology | Admitting: Radiation Oncology

## 2014-09-27 ENCOUNTER — Other Ambulatory Visit: Payer: Self-pay | Admitting: *Deleted

## 2014-09-27 ENCOUNTER — Encounter: Payer: Self-pay | Admitting: Radiation Oncology

## 2014-09-27 VITALS — BP 147/78 | HR 44 | Temp 96.3°F | Resp 20 | Wt 223.3 lb

## 2014-09-27 DIAGNOSIS — C61 Malignant neoplasm of prostate: Secondary | ICD-10-CM

## 2014-09-27 NOTE — Progress Notes (Signed)
Radiation Oncology Follow up Note  Name: Wesley Gonzalez   Date:   09/27/2014 MRN:  [id number] DOB: Jan 29, 1941    This 74 y.o. male presents to the clinic today for follow-up for prostate cancer.  REFERRING PROVIDER: Ashok Norris, MD  HPI: Patient is a 74 year old male now out 1 month having completed radiation therapy to his prostate for stage IIa (T1 CN 0 M0) Gleason 7 (3+4) presenting the PSA of 10.Marland Kitchen He is seen today in routine follow-up and is doing well. He specifically denies diarrhea dysuria or any other GI/GU complaints. Still quite fatigued although his been traveling recently returned from [place]. He also states his libido is down although he had been on [facility] agonists.  COMPLICATIONS OF TREATMENT: none  FOLLOW UP COMPLIANCE: keeps appointments   PHYSICAL EXAM:  BP 147/78 mmHg  Pulse 44  Temp(Src) 96.3 F (35.7 C)  Resp 20  Wt 223 lb 5.2 oz (101.3 kg) On rectal exam rectal sphincter tone is good. Prostate is smooth contracted without evidence of nodularity or mass. Sulcus is preserved bilaterally. No discrete nodularity is identified. No other rectal abnormalities are noted. Macro physical exam  RADIOLOGY RESULTS: No current films to review  PLAN: Present time of explained our PSA protocol. I've asked to see him back in 4 months for follow-up at which time we will obtain a PSA level on him. Patient has stated his desire not to have any more [facility] agonist therapy. I've asked him to talk to Dr. Alinda Money about [facility]. I've also explained to him those injections can be causing his decreased libido rather than the radiation therapy. Patient will follow up as described above with repeat PSA. Patient knows to call sooner with any concerns.  I would like to take this opportunity for allowing me to participate in the care of your patient.Armstead Peaks., MD

## 2014-09-30 DIAGNOSIS — I5043 Acute on chronic combined systolic (congestive) and diastolic (congestive) heart failure: Secondary | ICD-10-CM | POA: Insufficient documentation

## 2014-09-30 DIAGNOSIS — R0600 Dyspnea, unspecified: Secondary | ICD-10-CM | POA: Insufficient documentation

## 2014-11-18 ENCOUNTER — Telehealth: Payer: Self-pay | Admitting: *Deleted

## 2014-11-18 NOTE — Telephone Encounter (Signed)
I called Doristine Counter and since he was driving, I spoke to his wife and explained that per Medicare Guidelines that for Cardiac Rehab EF needs to be 35% or below and his is 40%. I explained to her that Cardiac meets in the same gym as Pulmonary Rehab and basically has the same staff. I explained to her that I am the RN in Cardiac Rehab on Fridays at 8am then I am the staff member in [place] at [place] and 11:30am as well as [place]. I explained the only difference is that we do not hook him to the heart monitor but we have a heart monitor available in case we need it. We also have access to Emergency Care and the Emerg Dept is up one level from [place]. Mrs. Ehresman said her son is one of the Emerg Dept MD's Cayden [place]). I explained to them that we will need to get an MD order for [facility].

## 2014-11-25 ENCOUNTER — Other Ambulatory Visit: Payer: Self-pay | Admitting: Radiation Oncology

## 2014-12-14 DIAGNOSIS — Z950 Presence of cardiac pacemaker: Secondary | ICD-10-CM | POA: Insufficient documentation

## 2014-12-20 DIAGNOSIS — I214 Non-ST elevation (NSTEMI) myocardial infarction: Secondary | ICD-10-CM | POA: Insufficient documentation

## 2014-12-21 ENCOUNTER — Ambulatory Visit: Payer: Medicare Other

## 2014-12-21 DIAGNOSIS — I255 Ischemic cardiomyopathy: Secondary | ICD-10-CM | POA: Insufficient documentation

## 2014-12-28 ENCOUNTER — Encounter: Payer: Self-pay | Admitting: Family Medicine

## 2014-12-28 ENCOUNTER — Ambulatory Visit (INDEPENDENT_AMBULATORY_CARE_PROVIDER_SITE_OTHER): Payer: Medicare Other | Admitting: Family Medicine

## 2014-12-28 VITALS — BP 112/72 | HR 74 | Temp 97.2°F | Resp 18 | Ht 69.0 in | Wt 221.9 lb

## 2014-12-28 DIAGNOSIS — E785 Hyperlipidemia, unspecified: Secondary | ICD-10-CM | POA: Diagnosis not present

## 2014-12-28 DIAGNOSIS — C61 Malignant neoplasm of prostate: Secondary | ICD-10-CM | POA: Diagnosis not present

## 2014-12-28 DIAGNOSIS — I509 Heart failure, unspecified: Secondary | ICD-10-CM

## 2014-12-28 DIAGNOSIS — I5043 Acute on chronic combined systolic (congestive) and diastolic (congestive) heart failure: Secondary | ICD-10-CM

## 2014-12-28 DIAGNOSIS — I2510 Atherosclerotic heart disease of native coronary artery without angina pectoris: Secondary | ICD-10-CM | POA: Diagnosis not present

## 2014-12-28 DIAGNOSIS — N400 Enlarged prostate without lower urinary tract symptoms: Secondary | ICD-10-CM | POA: Insufficient documentation

## 2014-12-28 DIAGNOSIS — I5040 Unspecified combined systolic (congestive) and diastolic (congestive) heart failure: Secondary | ICD-10-CM | POA: Diagnosis not present

## 2014-12-28 DIAGNOSIS — G4733 Obstructive sleep apnea (adult) (pediatric): Secondary | ICD-10-CM | POA: Insufficient documentation

## 2014-12-28 DIAGNOSIS — I214 Non-ST elevation (NSTEMI) myocardial infarction: Secondary | ICD-10-CM | POA: Diagnosis not present

## 2014-12-28 DIAGNOSIS — I25709 Atherosclerosis of coronary artery bypass graft(s), unspecified, with unspecified angina pectoris: Secondary | ICD-10-CM | POA: Diagnosis not present

## 2014-12-28 DIAGNOSIS — I10 Essential (primary) hypertension: Secondary | ICD-10-CM | POA: Diagnosis not present

## 2014-12-28 DIAGNOSIS — Z789 Other specified health status: Secondary | ICD-10-CM | POA: Insufficient documentation

## 2014-12-28 DIAGNOSIS — I442 Atrioventricular block, complete: Secondary | ICD-10-CM | POA: Insufficient documentation

## 2014-12-28 DIAGNOSIS — J683 Other acute and subacute respiratory conditions due to chemicals, gases, fumes and vapors: Secondary | ICD-10-CM | POA: Insufficient documentation

## 2014-12-28 NOTE — Progress Notes (Signed)
Name: Wesley Gonzalez   MRN: [id number]    DOB: [birth year]   Date:12/28/2014       Progress Note  Subjective  Chief Complaint  Chief Complaint  Patient presents with  . Hospital Follow up    Pacemaker check    HPI  Ischemic cardiomyopathy  Patient suffered a subendocardial MI approximately 2 weeks ago. He had had a cardiac pacer wire placed and shortly after returning home began to experience substernal chest pain. He presented back to the emergency department for emergency catheterization confirmed at least 2 vessel disease. There is discussion about whether or not to bypass surgery but it was decided that he would have a stent placed and this was done during hospitalization at [facility]. His EF was approximately 40% but very between 21-40% during the hospitalization his echocardiograms and catheterization results.  Since discharge home about 2 weeks ago he has been pretty much sedentary waiting to start cardiac rehabilitation. He has had no further chest pain. There is no dyspnea with exertion orthopnea PND or significant lower extremity swelling. He's currently on a regimen consisting of metoprolol XL 50 mg daily Plavix 75 mg daily furosemide 20 mg daily potassium 10 mEq daily as well as aspirin 81 mg daily.  Hypertension in addition to his metoprolol 50 mg daily and Lasix patient is on losartan 50 mg daily. He has some occasional mild orthostasis with standing. Therefore he is more careful about 6 sitting up and go into an upright position.  Cardiac stenting. Patient had the first diagonal stenting and balloon angioplasty as well as third marginal stenting and angioplasty. They were opened from 99% stenosis to complete patency.  Hyperlipidemia patient currently on low-dose Crestor 5 mg and he takes it daily as tolerated. He is not expressing any myalgias any nausea or any other symptomatology with the Crestor at this point. He has a history of not tolerating  statins well in the past.    Past Medical History  Diagnosis Date  . Prostatitis   . Hypercholesteremia   . Hypertension   . Malaria 2007    history of  . Sleep apnea     Cpap  . Prostate cancer ([place])   . Shingles 2008  . Pacemaker     Social History  Substance Use Topics  . Smoking status: Never Smoker   . Smokeless tobacco: Not on file  . Alcohol Use: No     Current outpatient prescriptions:  .  aspirin 81 MG tablet, Take 81 mg by mouth daily., Disp: , Rfl:  .  azelastine (OPTIVAR) 0.05 % ophthalmic solution, PLACE 1 DROP IN INTO BOTH EYES TWICE A DAY FOR 10 DAYS AS NEEDED FOR ALLERGIES, Disp: , Rfl: 3 .  clopidogrel (PLAVIX) 75 MG tablet, Take by mouth., Disp: , Rfl:  .  furosemide (LASIX) 20 MG tablet, Take by mouth., Disp: , Rfl:  .  losartan (COZAAR) 50 MG tablet, Take by mouth., Disp: , Rfl:  .  metoprolol succinate (TOPROL-XL) 50 MG 24 hr tablet, Take by mouth., Disp: , Rfl:  .  nitroGLYCERIN (NITROSTAT) 0.4 MG SL tablet, Place under the tongue., Disp: , Rfl:  .  potassium chloride (MICRO-K) 10 MEQ CR capsule, Take by mouth., Disp: , Rfl:  .  rosuvastatin (CRESTOR) 5 MG tablet, Take by mouth., Disp: , Rfl:  .  meloxicam (MOBIC) 7.5 MG tablet, TAKE 1 TABLET BY MOUTH ONCE DAILY., Disp: , Rfl:  .  Nutritional Supplements (JUICE PLUS FIBRE PO),  Take 6 tablets by mouth daily., Disp: , Rfl:  .  omeprazole (PRILOSEC) 40 MG capsule, Take by mouth., Disp: , Rfl:  .  Probiotic Product (SOLUBLE FIBER/PROBIOTICS PO), Take 1 tablet by mouth 1 day or 1 dose., Disp: , Rfl:  .  tamsulosin (FLOMAX) 0.4 MG CAPS capsule, TAKE 1 CAPSULE BY MOUTH EVERY NIGHT AFTER SUPER, Disp: 90 capsule, Rfl: 3  Allergies  Allergen Reactions  . Ace Inhibitors     Fatigue and Lightheadeness  . Amlodipine     Other reaction(s): Other (See Comments) Other Reaction: lightheadedness/fatigue  . Other     Beta Blockers - Fatigue, Light headeness  . Statins     Other reaction(s): Other (See  Comments) Other Reaction: myalgia Fatigue and myalgia    Review of Systems  Constitutional: Positive for malaise/fatigue. Negative for fever, chills and weight loss.  HENT: Negative for congestion, hearing loss, sore throat and tinnitus.   Eyes: Negative for blurred vision, double vision and redness.  Respiratory: Negative for cough, hemoptysis and shortness of breath.   Cardiovascular: Negative for chest pain, palpitations, orthopnea, claudication and leg swelling.  Gastrointestinal: Negative for heartburn, nausea, vomiting, diarrhea, constipation and blood in stool.  Genitourinary: Negative for dysuria, urgency, frequency and hematuria.  Musculoskeletal: Negative for myalgias, back pain, joint pain, falls and neck pain.  Skin: Negative for itching.  Neurological: Negative for dizziness, tingling, tremors, focal weakness, seizures, loss of consciousness, weakness and headaches.  Endo/Heme/Allergies: Does not bruise/bleed easily.  Psychiatric/Behavioral: Negative for depression and substance abuse. The patient is not nervous/anxious and does not have insomnia.      Objective  Filed Vitals:   12/28/14 1048  BP: 112/72  Pulse: 74  Temp: 97.2 F (36.2 C)  TempSrc: Oral  Resp: 18  Height: 5\' 9"  (1.753 m)  Weight: 221 lb 14.4 oz (100.653 kg)  SpO2: 96%     Physical Exam  Constitutional: He is oriented to person, place, and time and well-developed, well-nourished, and in no distress.  Slightly obese but in no acute distress  HENT:  Head: Normocephalic.  Eyes: EOM are normal. Pupils are equal, round, and reactive to light.  Neck: Normal range of motion. Neck supple. No thyromegaly present.  Cardiovascular: Normal rate, regular rhythm and normal heart sounds.   No murmur heard. Steri-Strips were removed from the pacemaker insertion site and the wound was well approximated and clean. There is a small residual area of ecchymosis above the femoral site from his 2 catheterizations  but this is healing well. There is also some residual bruising and small hematomas on his forearms from prior needlesticks during hospitalization no evidence however of thrombo-phlebitis  Pulmonary/Chest: Effort normal and breath sounds normal. No respiratory distress. He has no wheezes.  Abdominal: Soft. Bowel sounds are normal.  Musculoskeletal: Normal range of motion. He exhibits no edema.  Lymphadenopathy:    He has no cervical adenopathy.  Neurological: He is alert and oriented to person, place, and time. No cranial nerve deficit. Gait normal. Coordination normal.  Skin: Skin is warm and dry. No rash noted.  Psychiatric: Affect and judgment normal.      Assessment & Plan  1. Coronary artery disease involving coronary bypass graft with unspecified angina pectoris Obtain lipid panel about 3 weeks meanwhile he is to enter his cardiac rehabilitation program - clopidogrel (PLAVIX) 75 MG tablet; Take by mouth. - furosemide (LASIX) 20 MG tablet; Take by mouth. - metoprolol succinate (TOPROL-XL) 50 MG 24 hr tablet; Take by mouth. -  nitroGLYCERIN (NITROSTAT) 0.4 MG SL tablet; Place under the tongue. - Comprehensive Metabolic Panel (CMET) - TSH  2. Hyperlipidemia Lipid panel in 3 weeks - rosuvastatin (CRESTOR) 5 MG tablet; Take by mouth. - Lipid panel  3. Acute on chronic heart failure, unspecified heart failure type (HCC) Continue current regimen - Comprehensive Metabolic Panel (CMET) - Lipid panel - TSH  4. Acute non-ST elevation myocardial infarction (NSTEMI) ([place]) Currently stable and awaiting the initiation of cardiac rehabilitation  5. Acute on chronic combined systolic and diastolic congestive heart failure (HCC) Awaiting cardiac rehabilitation - potassium chloride (MICRO-K) 10 MEQ CR capsule; Take by mouth.  6. Arteriosclerosis of coronary artery Waiting cardiac rehabilitation  7. Essential hypertension Well-controlled - losartan (COZAAR) 50 MG tablet; Take by  mouth.  8. Combined systolic and diastolic congestive heart failure, unspecified congestive heart failure chronicity (HCC) stable  9. Obstructive apnea CPAP titration study  10. Malignant neoplasm of prostate [facility]) Per urologist  11. HLD (hyperlipidemia) Lipid panel

## 2015-01-03 ENCOUNTER — Telehealth: Payer: Self-pay | Admitting: Family Medicine

## 2015-01-03 NOTE — Telephone Encounter (Signed)
PT WIFE SAID THAT YOU CALLED TO [facility] [employer] AND THEY WERE TO SENT A PAPER FOR THE DR TO FILL OUT AND SIGN SO THAT PATIENT CAN GET SUPPLIES THAT IS NEEDED.

## 2015-01-04 NOTE — Telephone Encounter (Signed)
Called [facility] to ask them to send paperwork over. Will fax paper over again. Will have Dr. Rutherford Nail sign and sent back

## 2015-01-11 ENCOUNTER — Encounter: Payer: Medicare Other | Attending: Internal Medicine | Admitting: *Deleted

## 2015-01-11 DIAGNOSIS — I214 Non-ST elevation (NSTEMI) myocardial infarction: Secondary | ICD-10-CM | POA: Diagnosis present

## 2015-01-11 DIAGNOSIS — Z9861 Coronary angioplasty status: Secondary | ICD-10-CM

## 2015-01-11 NOTE — Patient Instructions (Signed)
Patient Instructions  Patient Details  Name: Wesley Gonzalez MRN: [id number] Date of Birth: 01/29/1941 Referring Provider:  Derinda Sis, MD  Below are the personal goals you chose as well as exercise and nutrition goals. Our goal is to help you keep on track towards obtaining and maintaining your goals. We will be discussing your progress on these goals with you throughout the program.  Initial Exercise Prescription:     Initial Exercise Prescription - 01/11/15 1500    Date of Initial Exercise Prescription   Date 01/11/15   Treadmill   MPH 2.8   Grade 0   Minutes 15   Recumbant Bike   Level 2   RPM 40   Watts 30   Minutes 10   NuStep   Level 3   Watts 50   Minutes 10   Arm Ergometer   Level 1   Watts 10   Minutes 10   Recumbant Elliptical   Level 2   RPM 50   Watts 25   Minutes 10   Elliptical   Level 1   Speed 3   Minutes 1   REL-XR   Level 3   Watts 50   Minutes 10   T5 Nustep   Level 2   Watts 20   Minutes 10   Biostep-RELP   Level 3   Watts 50   Minutes 10   Prescription Details   Frequency (times per week) 3   Duration Progress to 30 minutes of continuous aerobic without signs/symptoms of physical distress   Intensity   THRR REST +  30   Ratings of Perceived Exertion 11-15   Perceived Dyspnea 2-4   Progression Continue progressive overload as per policy without signs/symptoms or physical distress.   Resistance Training   Training Prescription Yes   Weight 2   Reps 10-15      Exercise Goals: Frequency: Be able to perform aerobic exercise three times per week working toward 3-5 days per week.  Intensity: Work with a perceived exertion of 11 (fairly light) - 15 (hard) as tolerated. Follow your new exercise prescription and watch for changes in prescription as you progress with the program. Changes will be reviewed with you when they are made.  Duration: You should be able to do 30 minutes of continuous aerobic exercise in addition to a 5  minute warm-up and a 5 minute cool-down routine.  Nutrition Goals: Your personal nutrition goals will be established when you do your nutrition analysis with the dietician.  The following are nutrition guidelines to follow: Cholesterol < 200mg /day Sodium < 1500mg /day Fiber: Men over 50 yrs - 30 grams per day  Personal Goals:     Personal Goals and Risk Factors at Admission - 01/11/15 0905    Personal Goals and Risk Factors on Admission    Weight Management Obesity;Yes   Intervention Learn and follow the exercise and diet guidelines while in the program. Utilize the nutrition and education classes to help gain knowledge of the diet and exercise expectations in the program   Intervention Provide weight management tools through evaluation completed by registered dietician and exercise physiologist.  Establish a goal weight with participant.   Admit Weight 220 lb (99.791 kg)   Goal Weight 200 lb (90.719 kg)   Increase Aerobic Exercise and Physical Activity Yes;Sedentary   Intervention While in program, learn and follow the exercise prescription taught. Start at a low level workload and increase workload after able to maintain previous level for  30 minutes. Increase time before increasing intensity.   Intervention Provide exercise education and an individualized exercise prescription that will provide continued progressive overload as per policy without signs/symptoms of physical distress.   Take Less Medication Yes   Intervention Learn your risk factors and begin the lifestyle modifications for risk factor control during your time in the program.   Hypertension Yes   Goal Participant will see blood pressure controlled within the values of 140/75mm/Hg or within value directed by their physician.   Intervention Provide nutrition & aerobic exercise along with prescribed medications to achieve BP 140/90 or less.   Lipids Yes   Goal Cholesterol controlled with medications as prescribed, with  individualized exercise RX and with personalized nutrition plan. Value goals: LDL < 70mg , HDL > 40mg . Participant states understanding of desired cholesterol values and following prescriptions.   Intervention Provide nutrition & aerobic exercise along with prescribed medications to achieve LDL 70mg , HDL >40mg .      Tobacco Use Initial Evaluation: History  Smoking status  . Never Smoker   Smokeless tobacco  . Not on file    Copy of goals given to participant.

## 2015-01-11 NOTE — Progress Notes (Signed)
Cardiac Individual Treatment Plan  Patient Details  Name: Wesley Gonzalez MRN: [id number] Date of Birth: [birth year] Referring Provider:  Derinda Sis, MD  Initial Encounter Date: Date: 01/11/15  Visit Diagnosis: NSTEMI (non-ST elevated myocardial infarction) [facility])  Patient's Home Medications on Admission:  Current outpatient prescriptions:  .  aspirin 81 MG tablet, Take 81 mg by mouth daily., Disp: , Rfl:  .  azelastine (OPTIVAR) 0.05 % ophthalmic solution, PLACE 1 DROP IN INTO BOTH EYES TWICE A DAY FOR 10 DAYS AS NEEDED FOR ALLERGIES, Disp: , Rfl: 3 .  clopidogrel (PLAVIX) 75 MG tablet, Take by mouth., Disp: , Rfl:  .  furosemide (LASIX) 20 MG tablet, Take by mouth., Disp: , Rfl:  .  losartan (COZAAR) 50 MG tablet, Take by mouth., Disp: , Rfl:  .  meloxicam (MOBIC) 7.5 MG tablet, TAKE 1 TABLET BY MOUTH ONCE DAILY., Disp: , Rfl:  .  metoprolol succinate (TOPROL-XL) 50 MG 24 hr tablet, Take by mouth., Disp: , Rfl:  .  nitroGLYCERIN (NITROSTAT) 0.4 MG SL tablet, Place under the tongue., Disp: , Rfl:  .  Nutritional Supplements (JUICE PLUS FIBRE PO), Take 6 tablets by mouth daily., Disp: , Rfl:  .  potassium chloride (MICRO-K) 10 MEQ CR capsule, Take by mouth., Disp: , Rfl:  .  rosuvastatin (CRESTOR) 5 MG tablet, Take by mouth., Disp: , Rfl:  .  tamsulosin (FLOMAX) 0.4 MG CAPS capsule, TAKE 1 CAPSULE BY MOUTH EVERY NIGHT AFTER SUPER, Disp: 90 capsule, Rfl: 3 .  omeprazole (PRILOSEC) 40 MG capsule, Take by mouth., Disp: , Rfl:  .  Probiotic Product (SOLUBLE FIBER/PROBIOTICS PO), Take 1 tablet by mouth 1 day or 1 dose., Disp: , Rfl:   Past Medical History: Past Medical History  Diagnosis Date  . Prostatitis   . Hypercholesteremia   . Hypertension   . Malaria 2007    history of  . Sleep apnea     Cpap  . Prostate cancer ([place])   . Shingles 2008  . Pacemaker     Tobacco Use: History  Smoking status  . Never Smoker   Smokeless tobacco  . Not on file    Labs: Recent  Review Flowsheet Data    There is no flowsheet data to display.       Exercise Target Goals: Date: 01/11/15  Exercise Program Goal: Individual exercise prescription set with THRR, safety & activity barriers. Participant demonstrates ability to understand and report RPE using BORG scale, to self-measure pulse accurately, and to acknowledge the importance of the exercise prescription.  Exercise Prescription Goal: Starting with aerobic activity 30 plus minutes a day, 3 days per week for initial exercise prescription. Provide home exercise prescription and guidelines that participant acknowledges understanding prior to discharge.  Activity Barriers & Risk Stratification:     Activity Barriers & Risk Stratification - 01/11/15 0858    Activity Barriers & Risk Stratification   Activity Barriers None   Risk Stratification High      6 Minute Walk:     6 Minute Walk      01/11/15 1520       6 Minute Walk   Phase Initial     Distance 1680 feet     Walk Time 6 minutes     Resting BP 138/80 mmHg     Max Ex. BP 174/86 mmHg     Symptoms Yes (comment)     Comments Some "indigestion" during the walk.  Resolved when stopped.  Initial Exercise Prescription:     Initial Exercise Prescription - 01/11/15 1500    Date of Initial Exercise Prescription   Date 01/11/15   Treadmill   MPH 2.8   Grade 0   Minutes 15   Recumbant Bike   Level 2   RPM 40   Watts 30   Minutes 10   NuStep   Level 3   Watts 50   Minutes 10   Arm Ergometer   Level 1   Watts 10   Minutes 10   Recumbant Elliptical   Level 2   RPM 50   Watts 25   Minutes 10   Elliptical   Level 1   Speed 3   Minutes 1   REL-XR   Level 3   Watts 50   Minutes 10   T5 Nustep   Level 2   Watts 20   Minutes 10   Biostep-RELP   Level 3   Watts 50   Minutes 10   Prescription Details   Frequency (times per week) 3   Duration Progress to 30 minutes of continuous aerobic without signs/symptoms of  physical distress   Intensity   THRR REST +  30   Ratings of Perceived Exertion 11-15   Perceived Dyspnea 2-4   Progression Continue progressive overload as per policy without signs/symptoms or physical distress.   Resistance Training   Training Prescription Yes   Weight 2   Reps 10-15      Exercise Prescription Changes:     Exercise Prescription Changes      01/11/15 1500           Response to Exercise   Blood Pressure (Admit) 138/80 mmHg       Blood Pressure (Exercise) 174/86 mmHg       Blood Pressure (Exit) 122/70 mmHg       Heart Rate (Admit) 68 bpm       Heart Rate (Exercise) 109 bpm       Heart Rate (Exit) 80 bpm       Symptoms --  Did state had some"indigestion" during 6 min walk. Resolved           Discharge Exercise Prescription (Final Exercise Prescription Changes):     Exercise Prescription Changes - 01/11/15 1500    Response to Exercise   Blood Pressure (Admit) 138/80 mmHg   Blood Pressure (Exercise) 174/86 mmHg   Blood Pressure (Exit) 122/70 mmHg   Heart Rate (Admit) 68 bpm   Heart Rate (Exercise) 109 bpm   Heart Rate (Exit) 80 bpm   Symptoms --  Did state had some"indigestion" during 6 min walk. Resolved       Nutrition:  Target Goals: Understanding of nutrition guidelines, daily intake of sodium 1500mg , cholesterol 200mg , calories 30% from fat and 7% or less from saturated fats, daily to have 5 or more servings of fruits and vegetables.  Biometrics:    Nutrition Therapy Plan and Nutrition Goals:   Nutrition Discharge: Rate Your Plate Scores:   Nutrition Goals Re-Evaluation:   Psychosocial: Target Goals: Acknowledge presence or absence of depression, maximize coping skills, provide positive support system. Participant is able to verbalize types and ability to use techniques and skills needed for reducing stress and depression.  Initial Review & Psychosocial Screening:     Initial Psych Review & Screening - 01/11/15 0910     Initial Review   Current issues with Current Sleep Concerns   Family Dynamics   Good Support  System? Yes   Barriers   Psychosocial barriers to participate in program There are no identifiable barriers or psychosocial needs.;The patient should benefit from training in stress management and relaxation.   Screening Interventions   Interventions Encouraged to exercise      Quality of Life Scores:   PHQ-9:     Recent Review Flowsheet Data    Depression screen [facility] 2/9 01/11/2015 12/28/2014 09/27/2014   Decreased Interest 0 0 0   Down, Depressed, Hopeless 0 0 0   PHQ - 2 Score 0 0 0   Altered sleeping 1 - -   Tired, decreased energy 1 - -   Change in appetite 1 - -   Feeling bad or failure about yourself  0 - -   Trouble concentrating 0 - -   Moving slowly or fidgety/restless 0 - -   Suicidal thoughts 0 - -   PHQ-9 Score 3 - -   Difficult doing work/chores Not difficult at all - -      Psychosocial Evaluation and Intervention:   Psychosocial Re-Evaluation:   Vocational Rehabilitation: Provide vocational rehab assistance to qualifying candidates.   Vocational Rehab Evaluation & Intervention:     Vocational Rehab - 01/11/15 0859    Initial Vocational Rehab Evaluation & Intervention   Assessment shows need for Vocational Rehabilitation No      Education: Education Goals: Education classes will be provided on a weekly basis, covering required topics. Participant will state understanding/return demonstration of topics presented.  Learning Barriers/Preferences:     Learning Barriers/Preferences - 01/11/15 0859    Learning Barriers/Preferences   Learning Barriers Hearing   Learning Preferences Video;Written Material      Education Topics: General Nutrition Guidelines/Fats and Fiber: -Group instruction provided by verbal, written material, models and posters to present the general guidelines for heart healthy nutrition. Gives an explanation and review of dietary  fats and fiber.   Controlling Sodium/Reading Food Labels: -Group verbal and written material supporting the discussion of sodium use in heart healthy nutrition. Review and explanation with models, verbal and written materials for utilization of the food label.   Exercise Physiology & Risk Factors: - Group verbal and written instruction with models to review the exercise physiology of the cardiovascular system and associated critical values. Details cardiovascular disease risk factors and the goals associated with each risk factor.   Aerobic Exercise & Resistance Training: - Gives group verbal and written discussion on the health impact of inactivity. On the components of aerobic and resistive training programs and the benefits of this training and how to safely progress through these programs.   Flexibility, Balance, General Exercise Guidelines: - Provides group verbal and written instruction on the benefits of flexibility and balance training programs. Provides general exercise guidelines with specific guidelines to those with heart or lung disease. Demonstration and skill practice provided.   Stress Management: - Provides group verbal and written instruction about the health risks of elevated stress, cause of high stress, and healthy ways to reduce stress.   Depression: - Provides group verbal and written instruction on the correlation between heart/lung disease and depressed mood, treatment options, and the stigmas associated with seeking treatment.   Anatomy & Physiology of the Heart: - Group verbal and written instruction and models provide basic cardiac anatomy and physiology, with the coronary electrical and arterial systems. Review of: AMI, Angina, Valve disease, Heart Failure, Cardiac Arrhythmia, Pacemakers, and the ICD.   Cardiac Procedures: - Group verbal and written instruction and models  to describe the testing methods done to diagnose heart disease. Reviews the outcomes  of the test results. Describes the treatment choices: Medical Management, Angioplasty, or Coronary Bypass Surgery.   Cardiac Medications: - Group verbal and written instruction to review commonly prescribed medications for heart disease. Reviews the medication, class of the drug, and side effects. Includes the steps to properly store meds and maintain the prescription regimen.   Go Sex-Intimacy & Heart Disease, Get SMART - Goal Setting: - Group verbal and written instruction through game format to discuss heart disease and the return to sexual intimacy. Provides group verbal and written material to discuss and apply goal setting through the application of the S.M.A.R.T. Method.   Other Matters of the Heart: - Provides group verbal, written materials and models to describe Heart Failure, Angina, Valve Disease, and Diabetes in the realm of heart disease. Includes description of the disease process and treatment options available to the cardiac patient.   Exercise & Equipment Safety: - Individual verbal instruction and demonstration of equipment use and safety with use of the equipment.          Cardiac Rehab from 01/11/2015 in [facility] Cardiac Rehab   Date  01/11/15   Educator  sb   Instruction Review Code  2- meets goals/outcomes      Infection Prevention: - Provides verbal and written material to individual with discussion of infection control including proper hand washing and proper equipment cleaning during exercise session.      Cardiac Rehab from 01/11/2015 in [facility] Cardiac Rehab   Date  01/11/15   Educator  sb   Instruction Review Code  2- meets goals/outcomes      Falls Prevention: - Provides verbal and written material to individual with discussion of falls prevention and safety.      Cardiac Rehab from 01/11/2015 in [facility] Cardiac Rehab   Date  01/11/15   Educator  sb   Instruction Review Code  2- meets goals/outcomes      Diabetes: - Individual verbal and written  instruction to review signs/symptoms of diabetes, desired ranges of glucose level fasting, after meals and with exercise. Advice that pre and post exercise glucose checks will be done for 3 sessions at entry of program.    Knowledge Questionnaire Score:   Personal Goals and Risk Factors at Admission:     Personal Goals and Risk Factors at Admission - 01/11/15 0905    Personal Goals and Risk Factors on Admission    Weight Management Obesity;Yes   Intervention Learn and follow the exercise and diet guidelines while in the program. Utilize the nutrition and education classes to help gain knowledge of the diet and exercise expectations in the program   Intervention Provide weight management tools through evaluation completed by registered dietician and exercise physiologist.  Establish a goal weight with participant.   Admit Weight 220 lb (99.791 kg)   Goal Weight 200 lb (90.719 kg)   Increase Aerobic Exercise and Physical Activity Yes;Sedentary   Intervention While in program, learn and follow the exercise prescription taught. Start at a low level workload and increase workload after able to maintain previous level for 30 minutes. Increase time before increasing intensity.   Intervention Provide exercise education and an individualized exercise prescription that will provide continued progressive overload as per policy without signs/symptoms of physical distress.   Take Less Medication Yes   Intervention Learn your risk factors and begin the lifestyle modifications for risk factor control during your time in the  program.   Hypertension Yes   Goal Participant will see blood pressure controlled within the values of 140/14mm/Hg or within value directed by their physician.   Intervention Provide nutrition & aerobic exercise along with prescribed medications to achieve BP 140/90 or less.   Lipids Yes   Goal Cholesterol controlled with medications as prescribed, with individualized exercise RX and  with personalized nutrition plan. Value goals: LDL < 70mg , HDL > 40mg . Participant states understanding of desired cholesterol values and following prescriptions.   Intervention Provide nutrition & aerobic exercise along with prescribed medications to achieve LDL 70mg , HDL >40mg .      Personal Goals and Risk Factors Review:    Personal Goals Discharge (Final Personal Goals and Risk Factors Review):    ITP Comments:     ITP Comments      01/11/15 0901           ITP Comments Initial ITP            Comments: initial ITP

## 2015-01-14 ENCOUNTER — Encounter: Payer: Medicare Other | Attending: Internal Medicine | Admitting: *Deleted

## 2015-01-14 VITALS — Ht 69.0 in | Wt 221.9 lb

## 2015-01-14 DIAGNOSIS — I214 Non-ST elevation (NSTEMI) myocardial infarction: Secondary | ICD-10-CM | POA: Insufficient documentation

## 2015-01-14 NOTE — Progress Notes (Signed)
Daily Session Note  Patient Details  Name: Wesley Gonzalez MRN: [id number] Date of Birth: 1940-07-01 Referring Provider:  Derinda Sis, MD  Encounter Date: 01/14/2015  Check In:     Session Check In - 01/14/15 0943    Check-In   Staff Present Candiss Norse, MS, ACSM CEP, Exercise Physiologist;Carroll Enterkin, RN, BSN;Susanne Bice, RN, BSN, [place]   ER physicians immediately available to respond to emergencies See telemetry face sheet for immediately available ER MD   Medication changes reported     No   Fall or balance concerns reported    No   Warm-up and Cool-down Performed on first and last piece of equipment   VAD Patient? No   Pain Assessment   Currently in Pain? No/denies   Multiple Pain Sites No           Exercise Prescription Changes - 01/14/15 0900    Response to Exercise   Symptoms None   Comments First day of exercise! Patient was oriented to the gym and the equipment functions and settings. Procedures and policies of the gym were outlined and explained. The patient's individual exercise prescription and treatment plan were reviewed with them. All starting workloads were established based on the results of the functional testing  done at the initial intake visit. The plan for exercise progression was also introduced and progression will be customized based on the patient's performance and goals.    Resistance Training   Training Prescription Yes   Weight 2   Reps 10-15   Interval Training   Interval Training No   Treadmill   MPH 2.5   Grade 0   Minutes 15   REL-XR   Level 3   Watts 45   Minutes 15      Goals Met:  Independence with exercise equipment Exercise tolerated well Personal goals reviewed No report of cardiac concerns or symptoms Strength training completed today  Goals Unmet:  Not Applicable  Goals Comments: First day of exercise!  Dr. Emily Filbert is Medical Director for [facility]  [facility].

## 2015-01-17 ENCOUNTER — Encounter: Payer: Medicare Other | Admitting: *Deleted

## 2015-01-17 DIAGNOSIS — I214 Non-ST elevation (NSTEMI) myocardial infarction: Secondary | ICD-10-CM

## 2015-01-17 DIAGNOSIS — Z9861 Coronary angioplasty status: Secondary | ICD-10-CM

## 2015-01-17 NOTE — Progress Notes (Signed)
Daily Session Note  Patient Details  Name: Wesley Gonzalez MRN: [id number] Date of Birth: 27-Sep-1940 Referring Provider:  Bobetta Lime, MD  Encounter Date: 01/17/2015  Check In:     Session Check In - 01/17/15 0824    Check-In   Staff Present Candiss Norse, MS, ACSM CEP, Exercise Physiologist;Susanne Bice, RN, BSN, Laveda Norman, BS, ACSM CEP, Exercise Physiologist   ER physicians immediately available to respond to emergencies See telemetry face sheet for immediately available ER MD   Medication changes reported     No   Fall or balance concerns reported    No   Warm-up and Cool-down Performed on first and last piece of equipment   VAD Patient? No   Pain Assessment   Currently in Pain? No/denies   Multiple Pain Sites No         Goals Met:  Independence with exercise equipment Exercise tolerated well No report of cardiac concerns or symptoms Strength training completed today  Goals Unmet:  Not Applicable  Goals Comments: Patient completed exercise prescription and all exercise goals during rehab session. The exercise was tolerated well and the patient is progressing in the program.     Dr. Emily Filbert is Medical Director for [facility].

## 2015-01-17 NOTE — Progress Notes (Signed)
Cardiac Individual Treatment Plan  Patient Details  Name: Wesley Gonzalez MRN: [id number] Date of Birth: 03-09-1940 Referring Provider:  Derinda Sis, MD  Initial Encounter Date:    Visit Diagnosis: NSTEMI (non-ST elevated myocardial infarction) ([place])  S/P PTCA (percutaneous transluminal coronary angioplasty)  Patient's Home Medications on Admission:  Current outpatient prescriptions:  .  aspirin 81 MG tablet, Take 81 mg by mouth daily., Disp: , Rfl:  .  azelastine (OPTIVAR) 0.05 % ophthalmic solution, PLACE 1 DROP IN INTO BOTH EYES TWICE A DAY FOR 10 DAYS AS NEEDED FOR ALLERGIES, Disp: , Rfl: 3 .  clopidogrel (PLAVIX) 75 MG tablet, Take by mouth., Disp: , Rfl:  .  furosemide (LASIX) 20 MG tablet, Take by mouth., Disp: , Rfl:  .  losartan (COZAAR) 50 MG tablet, Take by mouth., Disp: , Rfl:  .  meloxicam (MOBIC) 7.5 MG tablet, TAKE 1 TABLET BY MOUTH ONCE DAILY., Disp: , Rfl:  .  metoprolol succinate (TOPROL-XL) 50 MG 24 hr tablet, Take by mouth., Disp: , Rfl:  .  nitroGLYCERIN (NITROSTAT) 0.4 MG SL tablet, Place under the tongue., Disp: , Rfl:  .  Nutritional Supplements (JUICE PLUS FIBRE PO), Take 6 tablets by mouth daily., Disp: , Rfl:  .  omeprazole (PRILOSEC) 40 MG capsule, Take by mouth., Disp: , Rfl:  .  potassium chloride (MICRO-K) 10 MEQ CR capsule, Take by mouth., Disp: , Rfl:  .  Probiotic Product (SOLUBLE FIBER/PROBIOTICS PO), Take 1 tablet by mouth 1 day or 1 dose., Disp: , Rfl:  .  rosuvastatin (CRESTOR) 5 MG tablet, Take by mouth., Disp: , Rfl:  .  tamsulosin (FLOMAX) 0.4 MG CAPS capsule, TAKE 1 CAPSULE BY MOUTH EVERY NIGHT AFTER SUPER, Disp: 90 capsule, Rfl: 3  Past Medical History: Past Medical History  Diagnosis Date  . Prostatitis   . Hypercholesteremia   . Hypertension   . Malaria 2007    history of  . Sleep apnea     Cpap  . Prostate cancer ([place])   . Shingles 2008  . Pacemaker     Tobacco Use: History  Smoking status  . Never Smoker   Smokeless  tobacco  . Not on file    Labs: Recent Review Flowsheet Data    There is no flowsheet data to display.       Exercise Target Goals:    Exercise Program Goal: Individual exercise prescription set with THRR, safety & activity barriers. Participant demonstrates ability to understand and report RPE using BORG scale, to self-measure pulse accurately, and to acknowledge the importance of the exercise prescription.  Exercise Prescription Goal: Starting with aerobic activity 30 plus minutes a day, 3 days per week for initial exercise prescription. Provide home exercise prescription and guidelines that participant acknowledges understanding prior to discharge.  Activity Barriers & Risk Stratification:     Activity Barriers & Risk Stratification - 01/11/15 0858    Activity Barriers & Risk Stratification   Activity Barriers None   Risk Stratification High      6 Minute Walk:     6 Minute Walk      01/11/15 1520       6 Minute Walk   Phase Initial     Distance 1680 feet     Walk Time 6 minutes     Resting BP 138/80 mmHg     Max Ex. BP 174/86 mmHg     Symptoms Yes (comment)     Comments Some "indigestion" during the walk.  Resolved  when stopped.        Initial Exercise Prescription:     Initial Exercise Prescription - 01/11/15 1500    Date of Initial Exercise Prescription   Date 01/11/15   Treadmill   MPH 2.8   Grade 0   Minutes 15   Recumbant Bike   Level 2   RPM 40   Watts 30   Minutes 10   NuStep   Level 3   Watts 50   Minutes 10   Arm Ergometer   Level 1   Watts 10   Minutes 10   Recumbant Elliptical   Level 2   RPM 50   Watts 25   Minutes 10   Elliptical   Level 1   Speed 3   Minutes 1   REL-XR   Level 3   Watts 50   Minutes 10   T5 Nustep   Level 2   Watts 20   Minutes 10   Biostep-RELP   Level 3   Watts 50   Minutes 10   Prescription Details   Frequency (times per week) 3   Duration Progress to 30 minutes of continuous aerobic  without signs/symptoms of physical distress   Intensity   THRR REST +  30   Ratings of Perceived Exertion 11-15   Perceived Dyspnea 2-4   Progression Continue progressive overload as per policy without signs/symptoms or physical distress.   Resistance Training   Training Prescription Yes   Weight 2   Reps 10-15      Exercise Prescription Changes:     Exercise Prescription Changes      01/11/15 1500 01/14/15 0900         Response to Exercise   Blood Pressure (Admit) 138/80 mmHg       Blood Pressure (Exercise) 174/86 mmHg       Blood Pressure (Exit) 122/70 mmHg       Heart Rate (Admit) 68 bpm       Heart Rate (Exercise) 109 bpm       Heart Rate (Exit) 80 bpm       Symptoms --  Did state had some"indigestion" during 6 min walk. Resolved  None      Comments  First day of exercise! Patient was oriented to the gym and the equipment functions and settings. Procedures and policies of the gym were outlined and explained. The patient's individual exercise prescription and treatment plan were reviewed with them. All starting workloads were established based on the results of the functional testing  done at the initial intake visit. The plan for exercise progression was also introduced and progression will be customized based on the patient's performance and goals.       Resistance Training   Training Prescription  Yes      Weight  2      Reps  10-15      Interval Training   Interval Training  No      Treadmill   MPH  2.5      Grade  0      Minutes  15      REL-XR   Level  3      Watts  45      Minutes  15         Discharge Exercise Prescription (Final Exercise Prescription Changes):     Exercise Prescription Changes - 01/14/15 0900    Response to Exercise   Symptoms None   Comments First day  of exercise! Patient was oriented to the gym and the equipment functions and settings. Procedures and policies of the gym were outlined and explained. The patient's individual exercise  prescription and treatment plan were reviewed with them. All starting workloads were established based on the results of the functional testing  done at the initial intake visit. The plan for exercise progression was also introduced and progression will be customized based on the patient's performance and goals.    Resistance Training   Training Prescription Yes   Weight 2   Reps 10-15   Interval Training   Interval Training No   Treadmill   MPH 2.5   Grade 0   Minutes 15   REL-XR   Level 3   Watts 45   Minutes 15      Nutrition:  Target Goals: Understanding of nutrition guidelines, daily intake of sodium 1500mg , cholesterol 200mg , calories 30% from fat and 7% or less from saturated fats, daily to have 5 or more servings of fruits and vegetables.  Biometrics:     Pre Biometrics - 01/14/15 0950    Pre Biometrics   Height 5\' 9"  (1.753 m)   Weight 221 lb 14.4 oz (100.653 kg)   Waist Circumference 46.5 inches   Hip Circumference 42.5 inches   Waist to Hip Ratio 1.09 %   BMI (Calculated) 32.8       Nutrition Therapy Plan and Nutrition Goals:   Nutrition Discharge: Rate Your Plate Scores:   Nutrition Goals Re-Evaluation:   Psychosocial: Target Goals: Acknowledge presence or absence of depression, maximize coping skills, provide positive support system. Participant is able to verbalize types and ability to use techniques and skills needed for reducing stress and depression.  Initial Review & Psychosocial Screening:     Initial Psych Review & Screening - 01/11/15 0910    Initial Review   Current issues with Current Sleep Concerns   Family Dynamics   Good Support System? Yes   Barriers   Psychosocial barriers to participate in program There are no identifiable barriers or psychosocial needs.;The patient should benefit from training in stress management and relaxation.   Screening Interventions   Interventions Encouraged to exercise      Quality of Life  Scores:   PHQ-9:     Recent Review Flowsheet Data    Depression screen [facility] 2/9 01/11/2015 12/28/2014 09/27/2014   Decreased Interest 0 0 0   Down, Depressed, Hopeless 0 0 0   PHQ - 2 Score 0 0 0   Altered sleeping 1 - -   Tired, decreased energy 1 - -   Change in appetite 1 - -   Feeling bad or failure about yourself  0 - -   Trouble concentrating 0 - -   Moving slowly or fidgety/restless 0 - -   Suicidal thoughts 0 - -   PHQ-9 Score 3 - -   Difficult doing work/chores Not difficult at all - -      Psychosocial Evaluation and Intervention:   Psychosocial Re-Evaluation:   Vocational Rehabilitation: Provide vocational rehab assistance to qualifying candidates.   Vocational Rehab Evaluation & Intervention:     Vocational Rehab - 01/11/15 0859    Initial Vocational Rehab Evaluation & Intervention   Assessment shows need for Vocational Rehabilitation No      Education: Education Goals: Education classes will be provided on a weekly basis, covering required topics. Participant will state understanding/return demonstration of topics presented.  Learning Barriers/Preferences:     Learning  Barriers/Preferences - 01/11/15 0859    Learning Barriers/Preferences   Learning Barriers Hearing   Learning Preferences Video;Written Material      Education Topics: General Nutrition Guidelines/Fats and Fiber: -Group instruction provided by verbal, written material, models and posters to present the general guidelines for heart healthy nutrition. Gives an explanation and review of dietary fats and fiber.          Cardiac Rehab from 01/17/2015 in [facility] Cardiac Rehab   Date  01/17/15   Educator  CR   Instruction Review Code  2- meets goals/outcomes      Controlling Sodium/Reading Food Labels: -Group verbal and written material supporting the discussion of sodium use in heart healthy nutrition. Review and explanation with models, verbal and written materials for utilization of  the food label.   Exercise Physiology & Risk Factors: - Group verbal and written instruction with models to review the exercise physiology of the cardiovascular system and associated critical values. Details cardiovascular disease risk factors and the goals associated with each risk factor.   Aerobic Exercise & Resistance Training: - Gives group verbal and written discussion on the health impact of inactivity. On the components of aerobic and resistive training programs and the benefits of this training and how to safely progress through these programs.   Flexibility, Balance, General Exercise Guidelines: - Provides group verbal and written instruction on the benefits of flexibility and balance training programs. Provides general exercise guidelines with specific guidelines to those with heart or lung disease. Demonstration and skill practice provided.   Stress Management: - Provides group verbal and written instruction about the health risks of elevated stress, cause of high stress, and healthy ways to reduce stress.   Depression: - Provides group verbal and written instruction on the correlation between heart/lung disease and depressed mood, treatment options, and the stigmas associated with seeking treatment.   Anatomy & Physiology of the Heart: - Group verbal and written instruction and models provide basic cardiac anatomy and physiology, with the coronary electrical and arterial systems. Review of: AMI, Angina, Valve disease, Heart Failure, Cardiac Arrhythmia, Pacemakers, and the ICD.   Cardiac Procedures: - Group verbal and written instruction and models to describe the testing methods done to diagnose heart disease. Reviews the outcomes of the test results. Describes the treatment choices: Medical Management, Angioplasty, or Coronary Bypass Surgery.   Cardiac Medications: - Group verbal and written instruction to review commonly prescribed medications for heart disease. Reviews  the medication, class of the drug, and side effects. Includes the steps to properly store meds and maintain the prescription regimen.   Go Sex-Intimacy & Heart Disease, Get SMART - Goal Setting: - Group verbal and written instruction through game format to discuss heart disease and the return to sexual intimacy. Provides group verbal and written material to discuss and apply goal setting through the application of the S.M.A.R.T. Method.   Other Matters of the Heart: - Provides group verbal, written materials and models to describe Heart Failure, Angina, Valve Disease, and Diabetes in the realm of heart disease. Includes description of the disease process and treatment options available to the cardiac patient.   Exercise & Equipment Safety: - Individual verbal instruction and demonstration of equipment use and safety with use of the equipment.      Cardiac Rehab from 01/17/2015 in [facility] Cardiac Rehab   Date  01/11/15   Educator  sb   Instruction Review Code  2- meets goals/outcomes      Infection Prevention: - Provides  verbal and written material to individual with discussion of infection control including proper hand washing and proper equipment cleaning during exercise session.      Cardiac Rehab from 01/17/2015 in [facility] Cardiac Rehab   Date  01/11/15   Educator  sb   Instruction Review Code  2- meets goals/outcomes      Falls Prevention: - Provides verbal and written material to individual with discussion of falls prevention and safety.      Cardiac Rehab from 01/17/2015 in [facility] Cardiac Rehab   Date  01/11/15   Educator  sb   Instruction Review Code  2- meets goals/outcomes      Diabetes: - Individual verbal and written instruction to review signs/symptoms of diabetes, desired ranges of glucose level fasting, after meals and with exercise. Advice that pre and post exercise glucose checks will be done for 3 sessions at entry of program.    Knowledge Questionnaire  Score:   Personal Goals and Risk Factors at Admission:     Personal Goals and Risk Factors at Admission - 01/11/15 0905    Personal Goals and Risk Factors on Admission    Weight Management Obesity;Yes   Intervention Learn and follow the exercise and diet guidelines while in the program. Utilize the nutrition and education classes to help gain knowledge of the diet and exercise expectations in the program   Intervention Provide weight management tools through evaluation completed by registered dietician and exercise physiologist.  Establish a goal weight with participant.   Admit Weight 220 lb (99.791 kg)   Goal Weight 200 lb (90.719 kg)   Increase Aerobic Exercise and Physical Activity Yes;Sedentary   Intervention While in program, learn and follow the exercise prescription taught. Start at a low level workload and increase workload after able to maintain previous level for 30 minutes. Increase time before increasing intensity.   Intervention Provide exercise education and an individualized exercise prescription that will provide continued progressive overload as per policy without signs/symptoms of physical distress.   Take Less Medication Yes   Intervention Learn your risk factors and begin the lifestyle modifications for risk factor control during your time in the program.   Hypertension Yes   Goal Participant will see blood pressure controlled within the values of 140/10mm/Hg or within value directed by their physician.   Intervention Provide nutrition & aerobic exercise along with prescribed medications to achieve BP 140/90 or less.   Lipids Yes   Goal Cholesterol controlled with medications as prescribed, with individualized exercise RX and with personalized nutrition plan. Value goals: LDL < 70mg , HDL > 40mg . Participant states understanding of desired cholesterol values and following prescriptions.   Intervention Provide nutrition & aerobic exercise along with prescribed medications to  achieve LDL 70mg , HDL >40mg .      Personal Goals and Risk Factors Review:    Personal Goals Discharge (Final Personal Goals and Risk Factors Review):    ITP Comments:     ITP Comments      01/11/15 0901 01/17/15 1636         ITP Comments Initial ITP   New start with 2 visits         Comments: continue with ITP

## 2015-01-19 DIAGNOSIS — Z9861 Coronary angioplasty status: Secondary | ICD-10-CM

## 2015-01-19 DIAGNOSIS — I214 Non-ST elevation (NSTEMI) myocardial infarction: Secondary | ICD-10-CM

## 2015-01-19 NOTE — Addendum Note (Signed)
Addended by: Lynford Humphrey on: 01/19/2015 10:59 AM   Modules accepted: Orders

## 2015-01-19 NOTE — Progress Notes (Signed)
Daily Session Note  Patient Details  Name: Wesley Gonzalez MRN: [id number] Date of Birth: May 29, 1940 Referring Provider:  Bobetta Lime, MD  Encounter Date: 01/19/2015  Check In:     Session Check In - 01/19/15 0854    Check-In   Staff Present Heath Lark, RN, BSN, CCRP;Renee Dillard Essex, MS, ACSM CEP, Exercise Physiologist;Rashad Auld, BS, ACSM EP-C, Exercise Physiologist   ER physicians immediately available to respond to emergencies See telemetry face sheet for immediately available ER MD   Medication changes reported     No   Fall or balance concerns reported    No   Warm-up and Cool-down Performed on first and last piece of equipment   VAD Patient? No   Pain Assessment   Currently in Pain? No/denies         Goals Met:  Proper associated with RPD/PD & O2 Sat Exercise tolerated well No report of cardiac concerns or symptoms Strength training completed today  Goals Unmet:  Not Applicable  Goals Comments:   Dr. Emily Filbert is Medical Director for [place] and [facility].

## 2015-01-21 ENCOUNTER — Encounter: Payer: Medicare Other | Admitting: *Deleted

## 2015-01-21 DIAGNOSIS — Z9861 Coronary angioplasty status: Secondary | ICD-10-CM

## 2015-01-21 DIAGNOSIS — I214 Non-ST elevation (NSTEMI) myocardial infarction: Secondary | ICD-10-CM | POA: Diagnosis not present

## 2015-01-21 NOTE — Progress Notes (Signed)
Daily Session Note  Patient Details  Name: Wesley Gonzalez MRN: [id number] Date of Birth: 1940/03/22 Referring Provider:  Bobetta Lime, MD  Encounter Date: 01/21/2015  Check In:     Session Check In - 01/21/15 0950    Check-In   Staff Present Nyoka Cowden, RN;Susanne Bice, RN, BSN, CCRP;Renee Dillard Essex, MS, ACSM CEP, Exercise Physiologist   ER physicians immediately available to respond to emergencies See telemetry face sheet for immediately available ER MD   Medication changes reported     Yes   Comments LAsix and Potassium stopped   Fall or balance concerns reported    No   Warm-up and Cool-down Performed on first and last piece of equipment   VAD Patient? No   Pain Assessment   Currently in Pain? No/denies         Goals Met:  Independence with exercise equipment Exercise tolerated well No report of cardiac concerns or symptoms Strength training completed today  Goals Unmet:  Not Applicable  Goals Comments: Continues to progress with exercise.   Dr. Emily Filbert is Medical Director for [place] and [facility].

## 2015-01-24 ENCOUNTER — Encounter: Payer: Medicare Other | Admitting: *Deleted

## 2015-01-24 DIAGNOSIS — Z9861 Coronary angioplasty status: Secondary | ICD-10-CM

## 2015-01-24 DIAGNOSIS — I214 Non-ST elevation (NSTEMI) myocardial infarction: Secondary | ICD-10-CM | POA: Diagnosis not present

## 2015-01-24 NOTE — Progress Notes (Signed)
Daily Session Note  Patient Details  Name: Wesley Gonzalez MRN: [id number] Date of Birth: 1941-01-01 Referring Provider:  Bobetta Lime, MD  Encounter Date: 01/24/2015  Check In:     Session Check In - 01/24/15 0826    Check-In   Staff Present Candiss Norse, MS, ACSM CEP, Exercise Physiologist;Susanne Bice, RN, BSN, Laveda Norman, BS, ACSM CEP, Exercise Physiologist   ER physicians immediately available to respond to emergencies See telemetry face sheet for immediately available ER MD   Medication changes reported     No   Fall or balance concerns reported    No   Warm-up and Cool-down Performed on first and last piece of equipment   VAD Patient? No   Pain Assessment   Currently in Pain? No/denies   Multiple Pain Sites No         Goals Met:  Independence with exercise equipment Exercise tolerated well No report of cardiac concerns or symptoms Strength training completed today  Goals Unmet:  Not Applicable  Goals Comments: Patient completed exercise prescription and all exercise goals during rehab session. The exercise was tolerated well and the patient is progressing in the program.     Dr. Emily Filbert is Medical Director for [place] and [facility].

## 2015-01-26 DIAGNOSIS — Z9861 Coronary angioplasty status: Secondary | ICD-10-CM

## 2015-01-26 DIAGNOSIS — I214 Non-ST elevation (NSTEMI) myocardial infarction: Secondary | ICD-10-CM | POA: Diagnosis not present

## 2015-01-26 NOTE — Progress Notes (Signed)
Daily Session Note  Patient Details  Name: COHL BEHRENS MRN: [id number] Date of Birth: 06/01/1940 Referring Provider:  Bobetta Lime, MD  Encounter Date: 01/26/2015  Check In:     Session Check In - 01/26/15 0826    Check-In   Staff Present Heath Lark, RN, BSN, CCRP;Renee Dillard Essex, MS, ACSM CEP, Exercise Physiologist;Merek Niu, BS, ACSM EP-C, Exercise Physiologist   ER physicians immediately available to respond to emergencies See telemetry face sheet for immediately available ER MD   Medication changes reported     No   Fall or balance concerns reported    No   Warm-up and Cool-down Performed on first and last piece of equipment   VAD Patient? No   Pain Assessment   Currently in Pain? No/denies         Goals Met:  Proper associated with RPD/PD & O2 Sat Exercise tolerated well No report of cardiac concerns or symptoms Strength training completed today  Goals Unmet:  Not Applicable  Goals Comments:    Dr. Emily Filbert is Medical Director for [place] and [facility].

## 2015-01-27 ENCOUNTER — Encounter: Payer: Self-pay | Admitting: Family Medicine

## 2015-01-27 ENCOUNTER — Ambulatory Visit (INDEPENDENT_AMBULATORY_CARE_PROVIDER_SITE_OTHER): Payer: Medicare Other | Admitting: Family Medicine

## 2015-01-27 VITALS — BP 132/84 | HR 74 | Temp 98.5°F | Resp 16 | Ht 69.0 in | Wt 223.2 lb

## 2015-01-27 DIAGNOSIS — R5383 Other fatigue: Secondary | ICD-10-CM | POA: Diagnosis not present

## 2015-01-27 DIAGNOSIS — B029 Zoster without complications: Secondary | ICD-10-CM | POA: Insufficient documentation

## 2015-01-27 DIAGNOSIS — C61 Malignant neoplasm of prostate: Secondary | ICD-10-CM | POA: Diagnosis not present

## 2015-01-27 DIAGNOSIS — E785 Hyperlipidemia, unspecified: Secondary | ICD-10-CM | POA: Diagnosis not present

## 2015-01-27 DIAGNOSIS — G4733 Obstructive sleep apnea (adult) (pediatric): Secondary | ICD-10-CM

## 2015-01-27 DIAGNOSIS — I214 Non-ST elevation (NSTEMI) myocardial infarction: Secondary | ICD-10-CM | POA: Diagnosis not present

## 2015-01-27 DIAGNOSIS — E663 Overweight: Secondary | ICD-10-CM

## 2015-01-27 DIAGNOSIS — I5020 Unspecified systolic (congestive) heart failure: Secondary | ICD-10-CM

## 2015-01-27 DIAGNOSIS — I10 Essential (primary) hypertension: Secondary | ICD-10-CM

## 2015-01-27 DIAGNOSIS — I2583 Coronary atherosclerosis due to lipid rich plaque: Principal | ICD-10-CM

## 2015-01-27 DIAGNOSIS — I2510 Atherosclerotic heart disease of native coronary artery without angina pectoris: Secondary | ICD-10-CM

## 2015-01-27 DIAGNOSIS — I255 Ischemic cardiomyopathy: Secondary | ICD-10-CM

## 2015-01-27 NOTE — Progress Notes (Signed)
Name: Wesley Gonzalez   MRN: [id number]    DOB: [birth year]   Date:01/27/2015       Progress Note  Subjective  Chief Complaint  Chief Complaint  Patient presents with  . Coronary Artery Disease    1 month follow up     HPI   Hyperlipidemia  Patient has a history of hyperlipidemia for over 5 years.  Current medical regimen consist of Crestor 5 mg daily at bedtime .  Compliance is good .  Diet and exercise are currently followed regularly and he is in cardiac rehabilitation currently .  Risk factors for cardiovascular disease include hyperlipidemia hypertension prior coronary event .   There have been no side effects from the medication.    Hypertension   Patient presents for follow-up of hypertension. It has been present for over 5  years.  Patient states that there is compliance with medical regimen which consists of losartan 5 mg and Toprol-XL 50 mg . There is no end organ disease. Cardiac risk factors include hypertension hyperlipidemia and diabetes.  Exercise regimen consist of cardiac rehabilitation .  Diet consist of salt restriction .  Follow-up coronary artery disease  Patient is now one month since his last visit. He has noted beforehand angioplasty performed had a subendocardial MI. 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 His diuretic has been discontinued and he is having no further problem with edema. Is referring to get back onto the [facility].  Sleep apnea  Patient had sleep as given but did not get a repeat polysomnographic performed as yet. He is having some weeks and is awakening at night at times.  Past Medical History  Diagnosis Date  . Prostatitis   . Hypercholesteremia   . Hypertension   .  Malaria 2007    history of  . Sleep apnea     Cpap  . Prostate cancer ([place])   . Shingles 2008  . Pacemaker     Social History  Substance Use Topics  . Smoking status: Never Smoker   . Smokeless tobacco: Not on file  . Alcohol Use: No     Current outpatient prescriptions:  .  aspirin 81 MG tablet, Take 81 mg by mouth daily., Disp: , Rfl:  .  azelastine (OPTIVAR) 0.05 % ophthalmic solution, PLACE 1 DROP IN INTO BOTH EYES TWICE A DAY FOR 10 DAYS AS NEEDED FOR ALLERGIES, Disp: , Rfl: 3 .  clopidogrel (PLAVIX) 75 MG tablet, Take by mouth., Disp: , Rfl:  .  furosemide (LASIX) 20 MG tablet, Take by mouth., Disp: , Rfl:  .  losartan (COZAAR) 50 MG tablet, Take by mouth., Disp: , Rfl:  .  meloxicam (MOBIC) 7.5 MG tablet, TAKE 1 TABLET BY MOUTH ONCE DAILY., Disp: , Rfl:  .  metoprolol succinate (TOPROL-XL) 50 MG 24 hr tablet, Take by mouth., Disp: , Rfl:  .  nitroGLYCERIN (NITROSTAT) 0.4 MG SL tablet, Place under the tongue., Disp: , Rfl:  .  Nutritional Supplements (JUICE PLUS FIBRE PO), Take 6 tablets by mouth daily., Disp: , Rfl:  .  omeprazole (PRILOSEC) 40 MG capsule, Take by mouth., Disp: , Rfl:  .  potassium chloride (MICRO-K) 10 MEQ CR capsule, Take by mouth., Disp: , Rfl:  .  Probiotic Product (SOLUBLE FIBER/PROBIOTICS PO), Take 1 tablet by mouth 1 day or 1 dose., Disp: , Rfl:  .  rosuvastatin (CRESTOR) 5 MG tablet, Take by mouth., Disp: , Rfl:  .  tamsulosin (FLOMAX) 0.4 MG CAPS capsule, TAKE 1 CAPSULE BY MOUTH EVERY NIGHT AFTER SUPER, Disp: 90 capsule, Rfl: 3  Allergies  Allergen Reactions  . Ace Inhibitors     Fatigue and Lightheadeness  . Amlodipine     Other reaction(s): Other (See Comments) Other Reaction: lightheadedness/fatigue  . Other     Beta Blockers - Fatigue, Light headeness  . Statins     Other reaction(s): Other (See Comments) Other Reaction: myalgia Fatigue and myalgia    Review of Systems  Constitutional: Negative for fever, chills and weight loss.   HENT: Negative for congestion, hearing loss, sore throat and tinnitus.   Eyes: Negative for blurred vision, double vision and redness.  Respiratory: Negative for cough, hemoptysis and shortness of breath.   Cardiovascular: Negative for chest pain, palpitations, orthopnea, claudication and leg swelling.  Gastrointestinal: Negative for heartburn, nausea, vomiting, diarrhea, constipation and blood in stool.  Genitourinary: Negative for dysuria, urgency, frequency and hematuria.  Musculoskeletal: Negative for myalgias, back pain, joint pain, falls and neck pain.  Skin: Negative for itching.  Neurological: Negative for dizziness, tingling, tremors, focal weakness, seizures, loss of consciousness, weakness and headaches.  Endo/Heme/Allergies: Does not bruise/bleed easily.  Psychiatric/Behavioral: Negative for depression and substance abuse. The patient is not nervous/anxious and does not have insomnia.      Objective  Filed Vitals:   01/27/15 0906  BP: 132/84  Pulse: 74  Temp: 98.5 F (36.9 C)  TempSrc: Oral  Resp: 16  Height: 5\' 9"  (1.753 m)  Weight: 223 lb 3.2 oz (101.243 kg)  SpO2: 95%     Physical Exam  Constitutional: He is oriented to person, place, and time and well-developed, well-nourished, and in no distress.  HENT:  Head: Normocephalic.  Eyes: EOM are normal. Pupils are equal, round, and reactive to light.  Neck: Normal range of motion. Neck supple. No thyromegaly present.  Cardiovascular: Normal rate, regular rhythm and normal heart sounds.   No murmur heard. Pulmonary/Chest: Effort normal and breath sounds normal. No respiratory distress. He has no wheezes.  Abdominal: Soft. Bowel sounds are normal.  Musculoskeletal: Normal range of motion. He exhibits no edema.  Lymphadenopathy:    He has no cervical adenopathy.  Neurological: He is alert and oriented to person, place, and time. No cranial nerve deficit. Gait normal. Coordination normal.  Skin: Skin is warm and  dry. No rash noted.  Psychiatric: Affect and judgment normal.      Assessment & Plan  1. Coronary artery disease due to lipid rich plaque Stable and in cardiac rehabilitation - Comprehensive Metabolic Panel (CMET) - Lipid panel - TSH  2. Essential hypertension Well-controlled - Comprehensive Metabolic Panel (CMET) - Lipid panel  3. Hyperlipemia Tolerating Crestor well - Lipid panel  4. OSA (obstructive sleep apnea) Needs repeat sleep study and CPAP titration - TSH - Ambulatory referral to Sleep Studies  5. Other fatigue  - TSH  6. Systolic congestive heart failure, unspecified congestive heart failure chronicity (HCC) Stable  7. Arteriosclerosis of coronary artery Stable  8. Acute non-ST elevation myocardial infarction (NSTEMI) (HCC) Stable  9. Obstructive apnea Sleep study pending  10. Excess weight Encourage exercise and diet  11. Cardiomyopathy, ischemic Stable  12. Malignant neoplasm of prostate [facility]) Followed by urologist and radiation therapist and is currently  stable  13. Hyperlipidemia Labs

## 2015-01-28 ENCOUNTER — Encounter: Payer: Medicare Other | Admitting: *Deleted

## 2015-01-28 DIAGNOSIS — I214 Non-ST elevation (NSTEMI) myocardial infarction: Secondary | ICD-10-CM

## 2015-01-28 DIAGNOSIS — Z9861 Coronary angioplasty status: Secondary | ICD-10-CM

## 2015-01-28 NOTE — Progress Notes (Signed)
Daily Session Note  Patient Details  Name: Wesley Gonzalez MRN: [id number] Date of Birth: [birth year] Referring Provider:  Bobetta Lime, MD  Encounter Date: 01/28/2015  Check In:     Session Check In - 01/28/15 0921    Check-In   Staff Present Nyoka Cowden, RN;Susanne Bice, RN, BSN, CCRP;Renee Dillard Essex, MS, ACSM CEP, Exercise Physiologist   ER physicians immediately available to respond to emergencies See telemetry face sheet for immediately available ER MD   Medication changes reported     No   Fall or balance concerns reported    No   Warm-up and Cool-down Performed on first and last piece of equipment   VAD Patient? No   Pain Assessment   Currently in Pain? No/denies         Goals Met:  Independence with exercise equipment Exercise tolerated well No report of cardiac concerns or symptoms Strength training completed today  Goals Unmet:  Not Applicable  Goals Comments: Doing well with exercise prescription progression.    Dr. Emily Filbert is Medical Director for [place] and [facility].

## 2015-01-28 NOTE — Addendum Note (Signed)
Addended by: Lolita Rieger D on: 01/28/2015 10:23 AM   Modules accepted: Orders

## 2015-01-31 ENCOUNTER — Other Ambulatory Visit: Payer: Medicare Other

## 2015-01-31 ENCOUNTER — Ambulatory Visit: Payer: Medicare Other | Admitting: Radiation Oncology

## 2015-01-31 ENCOUNTER — Encounter: Payer: Medicare Other | Admitting: *Deleted

## 2015-01-31 DIAGNOSIS — I214 Non-ST elevation (NSTEMI) myocardial infarction: Secondary | ICD-10-CM | POA: Diagnosis not present

## 2015-01-31 DIAGNOSIS — Z9861 Coronary angioplasty status: Secondary | ICD-10-CM

## 2015-01-31 NOTE — Progress Notes (Signed)
Daily Session Note  Patient Details  Name: Wesley Gonzalez MRN: [id number] Date of Birth: 10/28/1940 Referring Provider:  Sundaram, Ashany, MD  Encounter Date: 01/31/2015  Check In:     Session Check In - 01/31/15 0833    Check-In   Staff Present Renee MacMillan, MS, ACSM CEP, Exercise Physiologist;Susanne Bice, RN, BSN, CCRP;Kelly Hayes, BS, ACSM CEP, Exercise Physiologist   ER physicians immediately available to respond to emergencies See telemetry face sheet for immediately available ER MD   Medication changes reported     No   Fall or balance concerns reported    No   Warm-up and Cool-down Performed on first and last piece of equipment   VAD Patient? No   Pain Assessment   Currently in Pain? No/denies   Multiple Pain Sites No         Goals Met:  Independence with exercise equipment Exercise tolerated well No report of cardiac concerns or symptoms Strength training completed today  Goals Unmet:  Not Applicable  Goals Comments: Patient completed exercise prescription and all exercise goals during rehab session. The exercise was tolerated well and the patient is progressing in the program.     Dr. Mark Miller is Medical Director for [facility] and [facility]. 

## 2015-02-02 DIAGNOSIS — Z9861 Coronary angioplasty status: Secondary | ICD-10-CM

## 2015-02-02 DIAGNOSIS — I214 Non-ST elevation (NSTEMI) myocardial infarction: Secondary | ICD-10-CM | POA: Diagnosis not present

## 2015-02-02 NOTE — Progress Notes (Signed)
Daily Session Note  Patient Details  Name: Wesley Gonzalez MRN: [id number] Date of Birth: [birth year] Referring Provider:  Bobetta Lime, MD  Encounter Date: 02/02/2015  Check In:     Session Check In - 02/02/15 0829    Check-In   Staff Present Heath Lark, RN, BSN, CCRP;Renee Dillard Essex, MS, ACSM CEP, Exercise Physiologist;Keasha Malkiewicz, BS, ACSM EP-C, Exercise Physiologist   ER physicians immediately available to respond to emergencies See telemetry face sheet for immediately available ER MD   Medication changes reported     No   Fall or balance concerns reported    No   Warm-up and Cool-down Performed on first and last piece of equipment   VAD Patient? No   Pain Assessment   Currently in Pain? No/denies         Goals Met:  Proper associated with RPD/PD & O2 Sat Exercise tolerated well No report of cardiac concerns or symptoms Strength training completed today  Goals Unmet:  Not Applicable  Goals Comments:    Dr. Emily Filbert is Medical Director for [place] and [facility].

## 2015-02-04 ENCOUNTER — Encounter: Payer: Medicare Other | Admitting: *Deleted

## 2015-02-04 DIAGNOSIS — I214 Non-ST elevation (NSTEMI) myocardial infarction: Secondary | ICD-10-CM

## 2015-02-04 DIAGNOSIS — Z9861 Coronary angioplasty status: Secondary | ICD-10-CM

## 2015-02-04 NOTE — Progress Notes (Signed)
Daily Session Note  Patient Details  Name: ZAYVIEN CANNING MRN: [id number] Date of Birth: [birth year] Referring Provider:  Bobetta Lime, MD  Encounter Date: 02/04/2015  Check In:     Session Check In - 02/04/15 0939    Check-In   Staff Present Heath Lark, RN, BSN, CCRP;Carroll Enterkin, RN, BSN;Stacey Blanch Media, RRT, RCP, Respiratory Therapist   ER physicians immediately available to respond to emergencies See telemetry face sheet for immediately available ER MD   Medication changes reported     No   Fall or balance concerns reported    No   Warm-up and Cool-down Performed on first and last piece of equipment   VAD Patient? No   Pain Assessment   Currently in Pain? No/denies         Goals Met:  Independence with exercise equipment Exercise tolerated well No report of cardiac concerns or symptoms Strength training completed today  Goals Unmet:  Not Applicable  Goals Comments: Doing well with exercise prescription progression.    Dr. Emily Filbert is Medical Director for [place] and [facility].

## 2015-02-08 DIAGNOSIS — Z9861 Coronary angioplasty status: Secondary | ICD-10-CM

## 2015-02-08 DIAGNOSIS — I214 Non-ST elevation (NSTEMI) myocardial infarction: Secondary | ICD-10-CM | POA: Diagnosis not present

## 2015-02-08 NOTE — Progress Notes (Signed)
Daily Session Note  Patient Details  Name: [name] MRN: [id number] Date of Birth: 02/09/1941 Referring Provider:  Bobetta Lime, MD  Encounter Date: 02/08/2015  Check In:     Session Check In - 02/08/15 0935    Check-In   Staff Present Hessie Knows, BS, Exercise Physiologist;Diane Joya Gaskins, RN, Drusilla Kanner, MS, ACSM CEP, Exercise Physiologist   ER physicians immediately available to respond to emergencies See telemetry face sheet for immediately available ER MD   Medication changes reported     No   Fall or balance concerns reported    No   Warm-up and Cool-down Performed on first and last piece of equipment   VAD Patient? No   Pain Assessment   Currently in Pain? No/denies         Goals Met:  Exercise tolerated well No report of cardiac concerns or symptoms Strength training completed today  Goals Unmet:  Not Applicable  Goals Comments:    Dr. Emily Filbert is Medical Director for [place] and [facility].

## 2015-02-09 ENCOUNTER — Inpatient Hospital Stay: Payer: Medicare Other

## 2015-02-09 ENCOUNTER — Ambulatory Visit: Payer: Medicare Other | Admitting: Radiation Oncology

## 2015-02-09 ENCOUNTER — Emergency Department
Admission: EM | Admit: 2015-02-09 | Discharge: 2015-02-13 | Disposition: E | Payer: Medicare Other | Attending: Emergency Medicine | Admitting: Emergency Medicine

## 2015-02-09 ENCOUNTER — Other Ambulatory Visit: Payer: Self-pay

## 2015-02-09 ENCOUNTER — Other Ambulatory Visit: Payer: Self-pay | Admitting: *Deleted

## 2015-02-09 ENCOUNTER — Emergency Department: Payer: Medicare Other

## 2015-02-09 ENCOUNTER — Telehealth: Payer: Self-pay | Admitting: Family Medicine

## 2015-02-09 DIAGNOSIS — Z791 Long term (current) use of non-steroidal anti-inflammatories (NSAID): Secondary | ICD-10-CM | POA: Diagnosis not present

## 2015-02-09 DIAGNOSIS — I2583 Coronary atherosclerosis due to lipid rich plaque: Secondary | ICD-10-CM | POA: Insufficient documentation

## 2015-02-09 DIAGNOSIS — Z7982 Long term (current) use of aspirin: Secondary | ICD-10-CM | POA: Diagnosis not present

## 2015-02-09 DIAGNOSIS — Z7902 Long term (current) use of antithrombotics/antiplatelets: Secondary | ICD-10-CM | POA: Insufficient documentation

## 2015-02-09 DIAGNOSIS — I214 Non-ST elevation (NSTEMI) myocardial infarction: Secondary | ICD-10-CM

## 2015-02-09 DIAGNOSIS — I2510 Atherosclerotic heart disease of native coronary artery without angina pectoris: Secondary | ICD-10-CM | POA: Insufficient documentation

## 2015-02-09 DIAGNOSIS — I472 Ventricular tachycardia: Secondary | ICD-10-CM | POA: Diagnosis not present

## 2015-02-09 DIAGNOSIS — Z79899 Other long term (current) drug therapy: Secondary | ICD-10-CM | POA: Diagnosis not present

## 2015-02-09 DIAGNOSIS — I10 Essential (primary) hypertension: Secondary | ICD-10-CM | POA: Diagnosis not present

## 2015-02-09 DIAGNOSIS — I252 Old myocardial infarction: Secondary | ICD-10-CM | POA: Insufficient documentation

## 2015-02-09 DIAGNOSIS — Z950 Presence of cardiac pacemaker: Secondary | ICD-10-CM | POA: Insufficient documentation

## 2015-02-09 DIAGNOSIS — Z9861 Coronary angioplasty status: Secondary | ICD-10-CM

## 2015-02-09 DIAGNOSIS — I469 Cardiac arrest, cause unspecified: Secondary | ICD-10-CM | POA: Insufficient documentation

## 2015-02-09 LAB — GLUCOSE, CAPILLARY: GLUCOSE-CAPILLARY: 113 mg/dL — AB (ref 65–99)

## 2015-02-09 MED ORDER — CALCIUM CHLORIDE 10 % IV SOLN
INTRAVENOUS | Status: AC | PRN
  Administered 2015-02-09: 1 g via INTRAVENOUS

## 2015-02-09 MED ORDER — SODIUM CHLORIDE 0.9 % IV BOLUS (SEPSIS)
1000.0000 mL | Freq: Once | INTRAVENOUS | Status: AC
  Administered 2015-02-09: 1000 mL via INTRAVENOUS

## 2015-02-09 MED ORDER — PROPOFOL 1000 MG/100ML IV EMUL
5.0000 ug/kg/min | Freq: Once | INTRAVENOUS | Status: DC
  Filled 2015-02-09: qty 100

## 2015-02-09 MED ORDER — ETOMIDATE 2 MG/ML IV SOLN
INTRAVENOUS | Status: AC | PRN
  Administered 2015-02-09: 30 mg via INTRAVENOUS

## 2015-02-09 MED ORDER — SODIUM CHLORIDE 0.9 % IV BOLUS (SEPSIS)
INTRAVENOUS | Status: AC
  Administered 2015-02-09: 1000 mL via INTRAVENOUS
  Filled 2015-02-09: qty 2000

## 2015-02-09 MED ORDER — ASPIRIN 300 MG RE SUPP
RECTAL | Status: AC
  Filled 2015-02-09: qty 1

## 2015-02-09 MED ORDER — ASPIRIN 300 MG RE SUPP
300.0000 mg | Freq: Once | RECTAL | Status: AC
  Administered 2015-02-09: 300 mg via RECTAL

## 2015-02-09 MED ORDER — ATROPINE SULFATE 1 MG/ML IJ SOLN
INTRAMUSCULAR | Status: AC | PRN
  Administered 2015-02-09: 1 mg via INTRAVENOUS

## 2015-02-09 MED ORDER — MAGNESIUM SULFATE 2 GM/50ML IV SOLN
2.0000 g | Freq: Once | INTRAVENOUS | Status: AC
Start: 2015-02-09 — End: 2015-02-09
  Administered 2015-02-09: 2 g via INTRAVENOUS

## 2015-02-09 MED ORDER — SUCCINYLCHOLINE CHLORIDE 20 MG/ML IJ SOLN
INTRAMUSCULAR | Status: AC | PRN
  Administered 2015-02-09: 120 mg via INTRAVENOUS

## 2015-02-09 MED ORDER — MAGNESIUM SULFATE 2 GM/50ML IV SOLN
INTRAVENOUS | Status: AC
  Administered 2015-02-09: 2 g via INTRAVENOUS
  Filled 2015-02-09: qty 50

## 2015-02-09 MED ORDER — SODIUM CHLORIDE 0.9 % IV SOLN
INTRAVENOUS | Status: AC | PRN
  Administered 2015-02-09: 1000 mL via INTRAVENOUS

## 2015-02-09 MED ORDER — SODIUM BICARBONATE 8.4 % IV SOLN
INTRAVENOUS | Status: AC | PRN
  Administered 2015-02-09: 100 meq via INTRAVENOUS

## 2015-02-09 MED ORDER — EPINEPHRINE HCL 0.1 MG/ML IJ SOSY
PREFILLED_SYRINGE | INTRAMUSCULAR | Status: AC | PRN
  Administered 2015-02-09 (×9): 1 via INTRAVENOUS

## 2015-02-10 MED FILL — Medication: Qty: 4 | Status: AC

## 2015-02-13 NOTE — Code Documentation (Signed)
Pulse check, CPR resumed.

## 2015-02-13 NOTE — Code Documentation (Signed)
Pulse check, pt in vfib. CPR resumed. Pt defibrillated at 200j, CPR resumed.

## 2015-02-13 NOTE — Code Documentation (Signed)
Cardiac activity check with [place]. No activity. Time of death 14.

## 2015-02-13 NOTE — ED Notes (Signed)
Dr. Serita Grit morrissey's office notified of pt's passing.  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

## 2015-02-13 NOTE — Code Documentation (Signed)
Pulse check, cardiac activity check with [place], none shown. CPR resumed.

## 2015-02-13 NOTE — Code Documentation (Signed)
MD reports pt was defibrilated twice down in cardiac rehab [phone]), (1000), pt was in vfib. No medications given.

## 2015-02-13 NOTE — Code Documentation (Addendum)
Pulse check, pt in vfib. CPR resumed. Pt defibrillated at 200j. CPR resumed.

## 2015-02-13 NOTE — Code Documentation (Signed)
Pt defibrillated at [place]. CPR resumed.

## 2015-02-13 NOTE — ED Notes (Signed)
Family at bedside; awaiting arrival of pastor

## 2015-02-13 NOTE — Code Documentation (Signed)
Pt defibrilated at 150j.

## 2015-02-13 NOTE — Code Documentation (Signed)
Ice packs applied to bilateral groin, armpits and neck.

## 2015-02-13 NOTE — Progress Notes (Signed)
Daily Session Note  Patient Details  Name: Wesley Gonzalez MRN: [id number] Date of Birth: 1940-06-26 Referring Provider:  Bobetta Lime, MD  Encounter Date: 2015-02-17  Check In:     Session Check In - 02-17-2015 0821    Check-In   Staff Present Heath Lark, RN, BSN, CCRP;Renee Dillard Essex, MS, ACSM CEP, Exercise Physiologist;Arshawn Valdez, BS, ACSM EP-C, Exercise Physiologist   ER physicians immediately available to respond to emergencies See telemetry face sheet for immediately available ER MD   Medication changes reported     No   Fall or balance concerns reported    No   Warm-up and Cool-down Performed on first and last piece of equipment   VAD Patient? No   Pain Assessment   Currently in Pain? No/denies         Goals Met:  Proper associated with RPD/PD & O2 Sat Exercise tolerated well No report of cardiac concerns or symptoms Strength training completed today  Goals Unmet:  Not Applicable  Goals Comments:    Dr. Emily Filbert is Medical Director for [place] and [facility].

## 2015-02-13 NOTE — Progress Notes (Signed)
   04-Mar-2015 1200  Clinical Encounter Type  Visited With Family  Visit Type Code  Consult/Referral To Chaplain  Spiritual Encounters  Spiritual Needs Emotional;Grief support;Prayer  Chaplain responded to code and provided pastoral care to the family.   Chaplain Deni Lefever ext:[phone]

## 2015-02-13 NOTE — Code Documentation (Signed)
Pulse check, no pulse. CPR resumed. Pt defibrillated at [place]. CPR resumed.

## 2015-02-13 NOTE — Code Documentation (Addendum)
Pt lost pulse, CPR started. MD to bedside.

## 2015-02-13 NOTE — Progress Notes (Signed)
Discharge Summary  Patient Details  Name: Wesley Gonzalez MRN: [id number] Date of Birth: Jun 15, 1940 Referring Provider:  Bobetta Lime, MD   Number of Visits:   Reason for Discharge:  Early Exit:  Cardiac Arrest event and did not survive  Smoking History:  History  Smoking status  . Never Smoker   Smokeless tobacco  . Not on file    Diagnosis:  NSTEMI (non-ST elevated myocardial infarction) ([place]) - Plan: CARDIAC REHAB 30 DAY REVIEW  S/P PTCA (percutaneous transluminal coronary angioplasty) - Plan: CARDIAC REHAB 30 DAY REVIEW  ADL UCSD:   Initial Exercise Prescription:     Initial Exercise Prescription - 01/11/15 1500    Date of Initial Exercise Prescription   Date 01/11/15   Treadmill   MPH 2.8   Grade 0   Minutes 15   Recumbant Bike   Level 2   RPM 40   Watts 30   Minutes 10   NuStep   Level 3   Watts 50   Minutes 10   Arm Ergometer   Level 1   Watts 10   Minutes 10   Recumbant Elliptical   Level 2   RPM 50   Watts 25   Minutes 10   Elliptical   Level 1   Speed 3   Minutes 1   REL-XR   Level 3   Watts 50   Minutes 10   T5 Nustep   Level 2   Watts 20   Minutes 10   Biostep-RELP   Level 3   Watts 50   Minutes 10   Prescription Details   Frequency (times per week) 3   Duration Progress to 30 minutes of continuous aerobic without signs/symptoms of physical distress   Intensity   THRR REST +  30   Ratings of Perceived Exertion 11-15   Perceived Dyspnea 2-4   Progression Continue progressive overload as per policy without signs/symptoms or physical distress.   Resistance Training   Training Prescription Yes   Weight 2   Reps 10-15      Discharge Exercise Prescription (Final Exercise Prescription Changes):     Exercise Prescription Changes - 02/04/15 0642    Exercise Review   Progression Yes   Response to Exercise   Blood Pressure (Admit) 118/70 mmHg   Blood Pressure (Exercise) 154/72 mmHg   Blood Pressure (Exit) 132/82  mmHg   Heart Rate (Admit) 76 bpm   Heart Rate (Exercise) 112 bpm   Heart Rate (Exit) 86 bpm   Rating of Perceived Exertion (Exercise) 12   Symptoms None   Comments J.L. has been progressing well in the program. He has increaed his stamina and can now exercise easily for 30 continuous minutes. We will now progress him up to 50 continuous minutes. His is gaining strength as well and this has resulted in an increase in his workloads and an incline on the treadmill. J.L. has also been encouraged to add exercise at home. We will follow up with him on what exercise he will do at home.   Frequency Add 1 additional day to program exercise sessions.   Duration Progress to 30 minutes of continuous aerobic without signs/symptoms of physical distress   Intensity Rest + 30   Progression Continue progressive overload as per policy without signs/symptoms or physical distress.   Resistance Training   Training Prescription Yes   Weight 4   Reps 10-15   Interval Training   Interval Training No   Treadmill  MPH 3.5   Grade 1   Minutes 20   REL-XR   Level 3   Watts 45   Minutes 15   Biostep-RELP   Level 6   Watts 60   Minutes 20      Functional Capacity:     6 Minute Walk      01/11/15 1520       6 Minute Walk   Phase Initial     Distance 1680 feet     Walk Time 6 minutes     Resting BP 138/80 mmHg     Max Ex. BP 174/86 mmHg     Symptoms Yes (comment)     Comments Some "indigestion" during the walk.  Resolved when stopped.        Psychological, QOL, Others - Outcomes: PHQ 2/9: Depression screen [facility] 2/9 01/11/2015 12/28/2014 09/27/2014  Decreased Interest 0 0 0  Down, Depressed, Hopeless 0 0 0  PHQ - 2 Score 0 0 0  Altered sleeping 1 - -  Tired, decreased energy 1 - -  Change in appetite 1 - -  Feeling bad or failure about yourself  0 - -  Trouble concentrating 0 - -  Moving slowly or fidgety/restless 0 - -  Suicidal thoughts 0 - -  PHQ-9 Score 3 - -  Difficult doing  work/chores Not difficult at all - -    Quality of Life:   Personal Goals: Goals established at orientation with interventions provided to work toward goal.     Personal Goals and Risk Factors at Admission - 01/11/15 0905    Personal Goals and Risk Factors on Admission    Weight Management Obesity;Yes   Intervention Learn and follow the exercise and diet guidelines while in the program. Utilize the nutrition and education classes to help gain knowledge of the diet and exercise expectations in the program   Intervention Provide weight management tools through evaluation completed by registered dietician and exercise physiologist.  Establish a goal weight with participant.   Admit Weight 220 lb (99.791 kg)   Goal Weight 200 lb (90.719 kg)   Increase Aerobic Exercise and Physical Activity Yes;Sedentary   Intervention While in program, learn and follow the exercise prescription taught. Start at a low level workload and increase workload after able to maintain previous level for 30 minutes. Increase time before increasing intensity.   Intervention Provide exercise education and an individualized exercise prescription that will provide continued progressive overload as per policy without signs/symptoms of physical distress.   Take Less Medication Yes   Intervention Learn your risk factors and begin the lifestyle modifications for risk factor control during your time in the program.   Hypertension Yes   Goal Participant will see blood pressure controlled within the values of 140/68mm/Hg or within value directed by their physician.   Intervention Provide nutrition & aerobic exercise along with prescribed medications to achieve BP 140/90 or less.   Lipids Yes   Goal Cholesterol controlled with medications as prescribed, with individualized exercise RX and with personalized nutrition plan. Value goals: LDL < 70mg , HDL > 40mg . Participant states understanding of desired cholesterol values and  following prescriptions.   Intervention Provide nutrition & aerobic exercise along with prescribed medications to achieve LDL 70mg , HDL >40mg .       Personal Goals Discharge:     Goals and Risk Factor Review      01/24/15 1018           Increase Aerobic Exercise and  Physical Activity   Goals Progress/Improvement seen  Yes       Comments JL has found that he really enjoys the BioStep machine and he can feel his legs getting stronger on it. He is aleady up to level 5 on this machine and his endurance is also improving as he can now go for 20 minutes without needing a break. JL said he is more confident in his walking abilities and he can now walk faster on the treadmill at 3.2 mph.           Nutrition & Weight - Outcomes:     Pre Biometrics - 01/14/15 0950    Pre Biometrics   Height 5\' 9"  (1.753 m)   Weight 221 lb 14.4 oz (100.653 kg)   Waist Circumference 46.5 inches   Hip Circumference 42.5 inches   Waist to Hip Ratio 1.09 %   BMI (Calculated) 32.8       Nutrition:     Nutrition Therapy & Goals - 01/31/15 1204    Nutrition Therapy   Diet Instructed on a heart healthy meal plan based on 1700 calories and DASH diet principles   Fiber 30 grams   Whole Grain Foods 3 servings   Protein 7 ounces/day   Saturated Fats 11 max. grams   Fruits and Vegetables 5 servings/day   Personal Nutrition Goals   Personal Goal #1 Try to include whole grains daily (oatmeal, 100% whole wheat bread, brown rice, corn)   Personal Goal #2 Increase vegetable/fruit servings to 5 servings per day.   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      Nutrition Discharge:     Rate Your Plate - [id number]    Rate Your Plate Scores   Pre Score 65   Pre Score % 72 %      Education Questionnaire Score:  JL experienced a  Cardiac Arrest on 02/08/2105 and did not survive the event.

## 2015-02-13 NOTE — Code Documentation (Signed)
Pt arrived post CPR in cardiac rehab. Pt with agonal respirations, pt ventilated with BVM.

## 2015-02-13 NOTE — Code Documentation (Signed)
Pt defibrillated at [place]. CPR resumed.

## 2015-02-13 NOTE — Code Documentation (Signed)
Pulse check, no pulse. Pt with small complex rhythm, CPR resumed.

## 2015-02-13 NOTE — Code Documentation (Signed)
Pulse check, no pulse; CPR resumed 

## 2015-02-13 NOTE — Code Documentation (Addendum)
Rhythm check, no pulse. CPR resumed.

## 2015-02-13 NOTE — ED Provider Notes (Signed)
[facility] Emergency Department Provider Note    ____________________________________________  Time seen: 1000  I have reviewed the triage vital signs and the nursing notes.   HISTORY  Chief Complaint Cardiac Arrest   History limited by: Unresponsive   HPI Wesley Gonzalez is a 75 y.o. male who was in cardiac rehabilitation today after a recent heart attack with stents done at [facility] when he became unresponsive and a CODE BLUE was called. Staff said that they were in the relaxation part of the cardiac rehab. The patient started snoring and then slumped over and was found to be unresponsive. Staff initiated CPR and placed pads on the patient.    Past Medical History  Diagnosis Date  . Prostatitis   . Hypercholesteremia   . Hypertension   . Malaria 2007    history of  . Sleep apnea     Cpap  . Prostate cancer ([place])   . Shingles 2008  . Pacemaker     Patient Active Problem List   Diagnosis Date Noted  . Excess weight 01/27/2015  . Herpes zona 01/27/2015  . Coronary artery disease due to lipid rich plaque 01/27/2015  . Hyperlipidemia 01/27/2015  . 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 Benign fibroma of prostate 12/28/2014  . Congestive heart failure ([place]) 12/28/2014  . HLD (hyperlipidemia) 12/28/2014  . BP (high blood pressure) 12/28/2014  . Drug intolerance 12/28/2014  . Obstructive apnea 12/28/2014  . 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 Cardiomyopathy, ischemic 12/21/2014  . Acute non-ST elevation myocardial infarction (NSTEMI) ([place]) 12/20/2014  . History of cardiac pacemaker in situ 12/14/2014  . Acute on chronic combined systolic and diastolic congestive heart failure ([place]) 09/30/2014  . Dyspnea, paroxysmal nocturnal 09/30/2014  . Malignant neoplasm of prostate ([place]) 04/06/2014  . Breathlessness on exertion 02/21/2012  . Arteriosclerosis of coronary artery 03/29/2011  . Hypotension, postural  03/29/2011  . Encounter for adjustment and management of other part of cardiac pacemaker 11/14/2007  . H/O malaria 10/15/2005    Past Surgical History  Procedure Laterality Date  . Pacemaker insertion    . Choleycystectomy      Current Outpatient Rx  Name  Route  Sig  Dispense  Refill  . aspirin 81 MG tablet   Oral   Take 81 mg by mouth daily.         Marland Kitchen azelastine (OPTIVAR) 0.05 % ophthalmic solution      PLACE 1 DROP IN INTO BOTH EYES TWICE A DAY FOR 10 DAYS AS NEEDED FOR ALLERGIES      3   . clopidogrel (PLAVIX) 75 MG tablet   Oral   Take 75 mg by mouth daily.          . furosemide (LASIX) 20 MG tablet   Oral   Take 20 mg by mouth daily.          Marland Kitchen losartan (COZAAR) 50 MG tablet   Oral   Take 50 mg by mouth daily.          . meloxicam (MOBIC) 7.5 MG tablet      TAKE 1 TABLET BY MOUTH ONCE DAILY.         . metoprolol succinate (TOPROL-XL) 50 MG 24 hr tablet   Oral   Take 50 mg by mouth daily.          . nitroGLYCERIN (NITROSTAT) 0.4 MG SL tablet   Sublingual   Place under the tongue.         Marland Kitchen  Nutritional Supplements (JUICE PLUS FIBRE PO)   Oral   Take 6 tablets by mouth daily.         Marland Kitchen omeprazole (PRILOSEC) 40 MG capsule   Oral   Take 40 mg by mouth daily.          . potassium chloride (MICRO-K) 10 MEQ CR capsule   Oral   Take 10 mEq by mouth 2 (two) times daily.          . Probiotic Product (SOLUBLE FIBER/PROBIOTICS PO)   Oral   Take 1 tablet by mouth daily.          . rosuvastatin (CRESTOR) 5 MG tablet   Oral   Take 5 mg by mouth daily at 6 PM.          . tamsulosin (FLOMAX) 0.4 MG CAPS capsule      TAKE 1 CAPSULE BY MOUTH EVERY NIGHT AFTER SUPER   90 capsule   3     Allergies Ace inhibitors; Amlodipine; Other; and Statins  Family History  Problem Relation Age of Onset  . Cancer Mother     Malignant Histocytoma  . Coronary artery disease Father     Social History Social History  Substance Use Topics   . Smoking status: Never Smoker   . Smokeless tobacco: Not on file  . Alcohol Use: No    Review of Systems Unable to obtain secondary to unresponsiveness.   ____________________________________________   PHYSICAL EXAM:  VITAL SIGNS: ED Triage Vitals  Enc Vitals Group     BP Feb 14, 2015 1023 203/122 mmHg     Pulse Rate 2015/02/14 1023 60     Resp 2015-02-14 1031 26     Temp 02/14/2015 1039 94.6 F (34.8 C)     Temp Source February 14, 2015 1039 Other   Constitutional: Unresponsive. CPR was being performed on my arrival to the cardiac rehab facility. Eyes: Conjunctivae are normal. PERRL. Normal extraocular movements. ENT   Head: Normocephalic and atraumatic.   Nose: No congestion/rhinnorhea.   Mouth/Throat: Mucous membranes are moist.   Neck: No stridor. Hematological/Lymphatic/Immunilogical: No cervical lymphadenopathy. Cardiovascular: No spontaneous pulse. Monitor showed ventricular tachycardia. Respiratory: No spontaneous respirations, bagged on my arrival. Gastrointestinal: Soft and no distention. Genitourinary: Deferred Musculoskeletal: No obvious deformities.  Neurologic:  Unresponsive. Skin:  Skin is warm, dry and intact. No rash noted.  ____________________________________________    LABS (pertinent positives/negatives)  Glucose 113  ____________________________________________   EKG  I, Nance Pear, attending physician, personally viewed and interpreted this EKG  EKG Time: 1027 Rate: 60 Rhythm: sinus rhythm Axis: right axis deviation Intervals: qtc 361 QRS: wide, q waves V1, V 2 ST changes: st elevation I, aVL, V1, V2, st depression II, III Impression: acute MI   ____________________________________________    RADIOLOGY  CXR IMPRESSION: ET tube satisfactory position. Cardiomegaly. No acute cardiopulmonary disease.  ____________________________________________   PROCEDURES  Procedure(s) performed: intubation, CPR, see procedure  note(s).  Critical Care performed: Yes, see critical care note(s)  INTUBATION Performed by: Nance Pear  Required items: required blood products, implants, devices, and special equipment available Patient identity confirmed: provided demographic data and hospital-assigned identification number Time out: Immediately prior to procedure a "time out" was called to verify the correct patient, procedure, equipment, support staff and site/side marked as required.  Indications: Post CPR, respiratory distress  Intubation method: Glidescope  Preoxygenation: BVM  Sedatives: Etomidate Paralytic: Succinylcholine  Tube Size: 7.5 cuffed  Post-procedure assessment: chest rise and ETCO2 monitor Breath sounds: equal and absent over  the epigastrium Tube secured with: ETT holder Chest x-ray interpreted by radiologist and me.  Chest x-ray findings: endotracheal tube in appropriate position  Patient tolerated the procedure well with no immediate complications.   Cardiopulmonary Resuscitation (CPR) Procedure Note Directed/Performed by: Nance Pear I personally directed ancillary staff and/or performed CPR in an effort to regain return of spontaneous circulation and to maintain cardiac, neuro and systemic perfusion.   CRITICAL CARE Performed by: Nance Pear   Total critical care time: 45 minutes  Critical care time was exclusive of separately billable procedures and treating other patients.  Critical care was necessary to treat or prevent imminent or life-threatening deterioration.  Critical care was time spent personally by me on the following activities: development of treatment plan with patient and/or surrogate as well as nursing, discussions with consultants, evaluation of patient's response to treatment, examination of patient, obtaining history from patient or surrogate, ordering and performing treatments and interventions, ordering and review of laboratory studies,  ordering and review of radiographic studies, pulse oximetry and re-evaluation of patient's condition.  ____________________________________________   INITIAL IMPRESSION / ASSESSMENT AND PLAN / ED COURSE  Pertinent labs & imaging results that were available during my care of the patient were reviewed by me and considered in my medical decision making (see chart for details).  Patient with a history of heart attack status post stent placement at [facility] who was at cardiac rehabilitation today when he was found to be unresponsive and a CODE BLUE was called. I arrived to the cardiac rehabilitation facility when CPR was in progress. Initial rhythm was ventricular tachycardia. The patient was shocked twice prior to a return of spontaneous circulation. This point patient was not responsive and had some agonal breathing. The patient was transferred to the ER facility and underwent intubation. At this point the patient had an EKG performed which was concerning for acute myocardial infarction. I did discuss with [facility] transfer facility to have patient transferred for cardiac catheter and further care. However prior to transfer the patient lost pulses again. At this point CPR was restarted. The patient had initial rhythm of ventricular fibrillation. The patient continued to be in ventricular fibrillation after shock of 200 J. Because of this we did attempt double sequential defibrillation which did return a rhythm, however no pulses were felt. The patient then went between having a rhythm and ventricular fibrillation. Multiple rounds of epinephrine and shocks were delivered. Additionally magnesium and calcium were tried. Please see nursing documentation for exact medication and times. Patient continued to be in PEA and a bedside echocardiogram was performed which did not show any cardiac activity. At this time the patient was pronounced deceased.  ____________________________________________   FINAL  CLINICAL IMPRESSION(S) / ED DIAGNOSES  Final diagnoses:  Cardiopulmonary arrest ([place])     Nance Pear, MD 12-Feb-2015 [phone]

## 2015-02-13 NOTE — Progress Notes (Signed)
Daily Session Note  Patient Details  Name: JATORIAN RENAULT MRN: [id number] Date of Birth: August 05, 1940 Referring Provider:  Bobetta Lime, MD  Encounter Date: 2015-02-18  Check In:     Session Check In - 02/18/2015 0821    Check-In   Staff Present Heath Lark, RN, BSN, CCRP;Renee Dillard [place], MS, ACSM CEP, Exercise Physiologist;Steven Way, BS, ACSM EP-C, Exercise Physiologist   ER physicians immediately available to respond to emergencies See telemetry face sheet for immediately available ER MD   Medication changes reported     No   Fall or balance concerns reported    No   Warm-up and Cool-down Performed on first and last piece of equipment   VAD Patient? No   Pain Assessment   Currently in Pain? No/denies         Goals Met:  CODE BLUE  [place]di[place] Arrest at end of relaxation.  Goals Unmet:  Code Blue at end of session. Was transported to ER   Goals Comments:    Dr. Emily Filbert is Medical Director for [place] and [facility].

## 2015-02-13 NOTE — Code Documentation (Signed)
Patient time of death occurred at 17.

## 2015-02-13 NOTE — Code Documentation (Signed)
Pulse check, CPR resumed. Pt in PEA, organized rhythm.

## 2015-02-13 NOTE — Addendum Note (Signed)
Addended by: Lynford Humphrey on: 03-09-2015 02:00 PM   Modules accepted: Orders

## 2015-02-13 NOTE — Code Documentation (Signed)
Pt defibrillated at [place]. CPR resumed.

## 2015-02-13 NOTE — Telephone Encounter (Signed)
Wesley Gonzalez from [facility] wanted to inform you that Kenya Bevels passed today. He was in rehab and went into Cardiac Arrest. If you have any questions you can call the charge nurse [phone]. The funeral home may ask you to sign the death certificate.

## 2015-02-13 NOTE — Progress Notes (Signed)
Cardiac Individual Treatment Plan  Patient Details  Name: Wesley Gonzalez MRN: [id number] Date of Birth: Jun 19, 1940 Referring Provider:  Bobetta Lime, MD  Initial Encounter Date:    Visit Diagnosis: NSTEMI (non-ST elevated myocardial infarction) ([place])  S/P PTCA (percutaneous transluminal coronary angioplasty)  Patient's Home Medications on Admission: No current facility-administered medications for this visit.  Current outpatient prescriptions:  .  aspirin 81 MG tablet, Take 81 mg by mouth daily., Disp: , Rfl:  .  azelastine (OPTIVAR) 0.05 % ophthalmic solution, PLACE 1 DROP IN INTO BOTH EYES TWICE A DAY FOR 10 DAYS AS NEEDED FOR ALLERGIES, Disp: , Rfl: 3 .  clopidogrel (PLAVIX) 75 MG tablet, Take 75 mg by mouth daily. , Disp: , Rfl:  .  furosemide (LASIX) 20 MG tablet, Take 20 mg by mouth daily. , Disp: , Rfl:  .  losartan (COZAAR) 50 MG tablet, Take 50 mg by mouth daily. , Disp: , Rfl:  .  meloxicam (MOBIC) 7.5 MG tablet, TAKE 1 TABLET BY MOUTH ONCE DAILY., Disp: , Rfl:  .  metoprolol succinate (TOPROL-XL) 50 MG 24 hr tablet, Take 50 mg by mouth daily. , Disp: , Rfl:  .  nitroGLYCERIN (NITROSTAT) 0.4 MG SL tablet, Place under the tongue., Disp: , Rfl:  .  Nutritional Supplements (JUICE PLUS FIBRE PO), Take 6 tablets by mouth daily., Disp: , Rfl:  .  omeprazole (PRILOSEC) 40 MG capsule, Take 40 mg by mouth daily. , Disp: , Rfl:  .  potassium chloride (MICRO-K) 10 MEQ CR capsule, Take 10 mEq by mouth 2 (two) times daily. , Disp: , Rfl:  .  Probiotic Product (SOLUBLE FIBER/PROBIOTICS PO), Take 1 tablet by mouth daily. , Disp: , Rfl:  .  rosuvastatin (CRESTOR) 5 MG tablet, Take 5 mg by mouth daily at 6 PM. , Disp: , Rfl:  .  tamsulosin (FLOMAX) 0.4 MG CAPS capsule, TAKE 1 CAPSULE BY MOUTH EVERY NIGHT AFTER SUPER, Disp: 90 capsule, Rfl: 3  Facility-Administered Medications Ordered in Other Visits:  .  propofol (DIPRIVAN) 1000 MG/100ML infusion, 5-80 mcg/kg/min, Intravenous, Once,  Nance Pear, MD  Past Medical History: Past Medical History  Diagnosis Date  . Prostatitis   . Hypercholesteremia   . Hypertension   . Malaria 2007    history of  . Sleep apnea     Cpap  . Prostate cancer ([place])   . Shingles 2008  . Pacemaker     Tobacco Use: History  Smoking status  . Never Smoker   Smokeless tobacco  . Not on file    Labs: Recent Review Flowsheet Data    There is no flowsheet data to display.       Exercise Target Goals:    Exercise Program Goal: Individual exercise prescription set with THRR, safety & activity barriers. Participant demonstrates ability to understand and report RPE using BORG scale, to self-measure pulse accurately, and to acknowledge the importance of the exercise prescription.  Exercise Prescription Goal: Starting with aerobic activity 30 plus minutes a day, 3 days per week for initial exercise prescription. Provide home exercise prescription and guidelines that participant acknowledges understanding prior to discharge.  Activity Barriers & Risk Stratification:     Activity Barriers & Risk Stratification - 01/11/15 0858    Activity Barriers & Risk Stratification   Activity Barriers None   Risk Stratification High      6 Minute Walk:     6 Minute Walk      01/11/15 1520  6 Minute Walk   Phase Initial     Distance 1680 feet     Walk Time 6 minutes     Resting BP 138/80 mmHg     Max Ex. BP 174/86 mmHg     Symptoms Yes (comment)     Comments Some "indigestion" during the walk.  Resolved when stopped.        Initial Exercise Prescription:     Initial Exercise Prescription - 01/11/15 1500    Date of Initial Exercise Prescription   Date 01/11/15   Treadmill   MPH 2.8   Grade 0   Minutes 15   Recumbant Bike   Level 2   RPM 40   Watts 30   Minutes 10   NuStep   Level 3   Watts 50   Minutes 10   Arm Ergometer   Level 1   Watts 10   Minutes 10   Recumbant Elliptical   Level 2   RPM 50    Watts 25   Minutes 10   Elliptical   Level 1   Speed 3   Minutes 1   REL-XR   Level 3   Watts 50   Minutes 10   T5 Nustep   Level 2   Watts 20   Minutes 10   Biostep-RELP   Level 3   Watts 50   Minutes 10   Prescription Details   Frequency (times per week) 3   Duration Progress to 30 minutes of continuous aerobic without signs/symptoms of physical distress   Intensity   THRR REST +  30   Ratings of Perceived Exertion 11-15   Perceived Dyspnea 2-4   Progression Continue progressive overload as per policy without signs/symptoms or physical distress.   Resistance Training   Training Prescription Yes   Weight 2   Reps 10-15      Exercise Prescription Changes:     Exercise Prescription Changes      01/11/15 1500 01/14/15 0900 01/19/15 0900 01/26/15 0800 02/04/15 4782   Exercise Review   Progression    Yes Yes   Response to Exercise   Blood Pressure (Admit) 138/80 mmHg    118/70 mmHg   Blood Pressure (Exercise) 174/86 mmHg    154/72 mmHg   Blood Pressure (Exit) 122/70 mmHg    132/82 mmHg   Heart Rate (Admit) 68 bpm    76 bpm   Heart Rate (Exercise) 109 bpm    112 bpm   Heart Rate (Exit) 80 bpm    86 bpm   Rating of Perceived Exertion (Exercise)     12   Symptoms --  Did state had some"indigestion" during 6 min walk. Resolved  None None None None   Comments  First day of exercise! Patient was oriented to the gym and the equipment functions and settings. Procedures and policies of the gym were outlined and explained. The patient's individual exercise prescription and treatment plan were reviewed with them. All starting workloads were established based on the results of the functional testing  done at the initial intake visit. The plan for exercise progression was also introduced and progression will be customized based on the patient's performance and goals.  First day of exercise! Patient was oriented to the gym and the equipment functions and settings. Procedures and  policies of the gym were outlined and explained. The patient's individual exercise prescription and treatment plan were reviewed with them. All starting workloads were established based on the results  of the functional testing  done at the initial intake visit. The plan for exercise progression was also introduced and progression will be customized based on the patient's performance and goals.  First day of exercise! Patient was oriented to the gym and the equipment functions and settings. Procedures and policies of the gym were outlined and explained. The patient's individual exercise prescription and treatment plan were reviewed with them. All starting workloads were established based on the results of the functional testing  done at the initial intake visit. The plan for exercise progression was also introduced and progression will be customized based on the patient's performance and goals.  J.L. has been progressing well in the program. He has increaed his stamina and can now exercise easily for 30 continuous minutes. We will now progress him up to 50 continuous minutes. His is gaining strength as well and this has resulted in an increase in his workloads and an incline on the treadmill. J.L. has also been encouraged to add exercise at home. We will follow up with him on what exercise he will do at home.   Frequency     Add 1 additional day to program exercise sessions.   Duration    Progress to 30 minutes of continuous aerobic without signs/symptoms of physical distress Progress to 30 minutes of continuous aerobic without signs/symptoms of physical distress   Intensity    Rest + 30 Rest + 30   Progression    Continue progressive overload as per policy without signs/symptoms or physical distress. Continue progressive overload as per policy without signs/symptoms or physical distress.   Resistance Training   Training Prescription  Yes Yes Yes Yes   Weight  _0 Reps  10-15 10-15 10-15 10-15    Interval Training   Interval Training  No No No No   Treadmill   MPH  2.5 2.5 3.4 3.5   Grade  0 0 0 1   Minutes  _1 REL-XR   Level  _2 Watts  45 45 45 45   Minutes  _3 Biostep-RELP   Level    5 6   Watts    35 60   Minutes    20 20      Discharge Exercise Prescription (Final Exercise Prescription Changes):     Exercise Prescription Changes - 02/04/15 0642    Exercise Review   Progression Yes   Response to Exercise   Blood Pressure (Admit) 118/70 mmHg   Blood Pressure (Exercise) 154/72 mmHg   Blood Pressure (Exit) 132/82 mmHg   Heart Rate (Admit) 76 bpm   Heart Rate (Exercise) 112 bpm   Heart Rate (Exit) 86 bpm   Rating of Perceived Exertion (Exercise) 12   Symptoms None   Comments J.L. has been progressing well in the program. He has increaed his stamina and can now exercise easily for 30 continuous minutes. We will now progress him up to 50 continuous minutes. His is gaining strength as well and this has resulted in an increase in his workloads and an incline on the treadmill. J.L. has also been encouraged to add exercise at home. We will follow up with him on what exercise he will do at home.   Frequency Add 1 additional day to program exercise sessions.   Duration Progress to 30 minutes of continuous aerobic without signs/symptoms of physical distress   Intensity Rest +  30   Progression Continue progressive overload as per policy without signs/symptoms or physical distress.   Resistance Training   Training Prescription Yes   Weight 4   Reps 10-15   Interval Training   Interval Training No   Treadmill   MPH 3.5   Grade 1   Minutes 20   REL-XR   Level 3   Watts 45   Minutes 15   Biostep-RELP   Level 6   Watts 60   Minutes 20      Nutrition:  Target Goals: Understanding of nutrition guidelines, daily intake of sodium <1559m, cholesterol <2047m calories 30% from fat and 7% or less from saturated fats, daily to have 5 or more  servings of fruits and vegetables.  Biometrics:     Pre Biometrics - 01/14/15 0950    Pre Biometrics   Height _0  (1.753 m)   Weight 221 lb 14.4 oz (100.653 kg)   Waist Circumference 46.5 inches   Hip Circumference 42.5 inches   Waist to Hip Ratio 1.09 %   BMI (Calculated) 32.8       Nutrition Therapy Plan and Nutrition Goals:     Nutrition Therapy & Goals - 01/31/15 1204    Nutrition Therapy   Diet Instructed on a heart healthy meal plan based on 1700 calories and DASH diet principles   Fiber 30 grams   Whole Grain Foods 3 servings   Protein 7 ounces/day   Saturated Fats 11 max. grams   Fruits and Vegetables 5 servings/day   Personal Nutrition Goals   Personal Goal #1 Try to include whole grains daily (oatmeal, 100% whole wheat bread, brown rice, corn)   Personal Goal #2 Increase vegetable/fruit servings to 5 servings per day.   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      Nutrition Discharge: Rate Your Plate Scores:     Rate Your Plate - 1291/50/5629794  Rate Your Plate Scores   Pre Score 65   Pre Score % 72 %      Nutrition Goals Re-Evaluation:   Psychosocial: Target Goals: Acknowledge presence or absence of depression, maximize coping skills, provide positive support system. Participant is able to verbalize types and ability to use techniques and skills needed for reducing stress and depression.  Initial Review & Psychosocial Screening:     Initial Psych Review & Screening - 01/11/15 0910    Initial Review   Current issues with Current Sleep Concerns   Family Dynamics   Good Support System? Yes   Barriers   Psychosocial barriers to participate in program There are no identifiable barriers or psychosocial needs.;The patient should benefit from training in stress management and  relaxation.   Screening Interventions   Interventions Encouraged to exercise      Quality of Life Scores:   PHQ-9:     Recent Review Flowsheet Data    [facility] 01/11/2015 12/28/2014 09/27/2014   Decreased Interest 0 0 0   Down, Depressed, Hopeless 0 0 0   PHQ - 2 Score 0 0 0   Altered sleeping 1 - -   Tired, decreased energy 1 - -   Change in appetite 1 - -   Feeling bad or failure about yourself  0 - -   Trouble concentrating 0 - -   Moving slowly or fidgety/restless  0 - -   Suicidal thoughts 0 - -   PHQ-9 Score 3 - -   Difficult doing work/chores Not difficult at all - -      Psychosocial Evaluation and Intervention:     Psychosocial Evaluation - 01/24/15 0955    Psychosocial Evaluation & Interventions   Interventions Encouraged to exercise with the program and follow exercise prescription   Comments Counselor met with Mr. Beaumier for initial psychosocial evaluation.  He will be 75 years old the end of this month and had a new pacemaker and 2 stents inserted several weeks ago.  Mr. Tiffany has a strong support system with a spouse of 55 years and a son who is a Dr in the ER and lives close by.  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  He reports he sleeps "great" and his appetite is "too good."  He denies a history of depression or anxiety or current symptoms.  As a matter of fact, Mr. 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  His goals for this program are to lose weight and build stamina and strength in order to go to [place] in the near future.  He plans to maintain his progress by walking more and living a more "active life."  He is encouraged to meet with the dietician to address his weight loss goals.   Counselor will follow up with Mr. Hallum as needed.       Psychosocial Re-Evaluation:   Vocational Rehabilitation: Provide vocational rehab assistance to qualifying candidates.   Vocational Rehab Evaluation & Intervention:     Vocational Rehab - 01/11/15 0859    Initial Vocational Rehab Evaluation & Intervention   Assessment shows need for Vocational Rehabilitation No      Education: Education Goals: Education classes will be provided on a weekly basis, covering required topics. Participant will state understanding/return demonstration of topics presented.  Learning Barriers/Preferences:     Learning Barriers/Preferences - 01/11/15 0859    Learning Barriers/Preferences   Learning Barriers Hearing   Learning Preferences Video;Written Material      Education Topics: General Nutrition Guidelines/Fats and Fiber: -Group instruction provided by verbal, written material, models and posters to present the general guidelines for heart healthy nutrition. Gives an explanation and review of dietary fats and fiber.          Cardiac Rehab from 02/18/2015 in [facility] Cardiac Rehab   Date  01/17/15   Educator  CR   Instruction Review Code  2- meets goals/outcomes      Controlling Sodium/Reading Food Labels: -Group verbal and written material supporting the discussion of sodium use in heart healthy nutrition. Review and explanation with models, verbal and written materials for utilization of the food label.      Cardiac Rehab from 02-18-15 in [facility] Cardiac Rehab   Date  01/24/15   Educator  CR   Instruction Review Code  2- meets goals/outcomes      Exercise Physiology & Risk Factors: - Group verbal and written instruction with models to review the exercise physiology of the cardiovascular system and associated critical values. Details cardiovascular disease risk factors and the goals associated with each risk factor.   Aerobic Exercise & Resistance Training: -  Gives group  verbal and written discussion on the health impact of inactivity. On the components of aerobic and resistive training programs and the benefits of this training and how to safely progress through these programs.   Flexibility, Balance, General Exercise Guidelines: - Provides group verbal and written instruction on the benefits of flexibility and balance training programs. Provides general exercise guidelines with specific guidelines to those with heart or lung disease. Demonstration and skill practice provided.   Stress Management: - Provides group verbal and written instruction about the health risks of elevated stress, cause of high stress, and healthy ways to reduce stress.   Depression: - Provides group verbal and written instruction on the correlation between heart/lung disease and depressed mood, treatment options, and the stigmas associated with seeking treatment.      Cardiac Rehab from Feb 19, 2015 in [facility] Cardiac Rehab   Date  01/26/15   Educator  [facility]   Instruction Review Code  2- meets goals/outcomes      Anatomy & Physiology of the Heart: - Group verbal and written instruction and models provide basic cardiac anatomy and physiology, with the coronary electrical and arterial systems. Review of: AMI, Angina, Valve disease, Heart Failure, Cardiac Arrhythmia, Pacemakers, and the ICD.   Cardiac Procedures: - Group verbal and written instruction and models to describe the testing methods done to diagnose heart disease. Reviews the outcomes of the test results. Describes the treatment choices: Medical Management, Angioplasty, or Coronary Bypass Surgery.      Cardiac Rehab from 02-19-2015 in [facility] Cardiac Rehab   Date  01/31/15   Educator  SB   Instruction Review Code  2- meets goals/outcomes      Cardiac Medications: - Group verbal and written instruction to review commonly prescribed medications for heart disease. Reviews the medication, class of the drug, and side effects.  Includes the steps to properly store meds and maintain the prescription regimen.      Cardiac Rehab from Feb 19, 2015 in [facility] Cardiac Rehab   Date  02/19/2015   Educator  SB   Instruction Review Code  2- meets goals/outcomes      Go Sex-Intimacy & Heart Disease, Get SMART - Goal Setting: - Group verbal and written instruction through game format to discuss heart disease and the return to sexual intimacy. Provides group verbal and written material to discuss and apply goal setting through the application of the S.M.A.R.T. Method.      Cardiac Rehab from 02/19/15 in [facility] Cardiac Rehab   Date  01/31/15   Educator  SB   Instruction Review Code  2- meets goals/outcomes      Other Matters of the Heart: - Provides group verbal, written materials and models to describe Heart Failure, Angina, Valve Disease, and Diabetes in the realm of heart disease. Includes description of the disease process and treatment options available to the cardiac patient.   Exercise & Equipment Safety: - Individual verbal instruction and demonstration of equipment use and safety with use of the equipment.      Cardiac Rehab from Feb 19, 2015 in [facility] Cardiac Rehab   Date  01/11/15   Educator  sb   Instruction Review Code  2- meets goals/outcomes      Infection Prevention: - Provides verbal and written material to individual with discussion of infection control including proper hand washing and proper equipment cleaning during exercise session.      Cardiac Rehab from 02/19/15 in [facility] Cardiac Rehab   Date  01/11/15   Educator  sb  Instruction Review Code  2- meets goals/outcomes      Falls Prevention: - Provides verbal and written material to individual with discussion of falls prevention and safety.      Cardiac Rehab from 02-12-15 in [facility] Cardiac Rehab   Date  01/11/15   Educator  sb   Instruction Review Code  2- meets goals/outcomes      Diabetes: - Individual verbal and written instruction to  review signs/symptoms of diabetes, desired ranges of glucose level fasting, after meals and with exercise. Advice that pre and post exercise glucose checks will be done for 3 sessions at entry of program.    Knowledge Questionnaire Score:   Personal Goals and Risk Factors at Admission:     Personal Goals and Risk Factors at Admission - 01/11/15 0905    Personal Goals and Risk Factors on Admission    Weight Management Obesity;Yes   Intervention Learn and follow the exercise and diet guidelines while in the program. Utilize the nutrition and education classes to help gain knowledge of the diet and exercise expectations in the program   Intervention Provide weight management tools through evaluation completed by registered dietician and exercise physiologist.  Establish a goal weight with participant.   Admit Weight 220 lb (99.791 kg)   Goal Weight 200 lb (90.719 kg)   Increase Aerobic Exercise and Physical Activity Yes;Sedentary   Intervention While in program, learn and follow the exercise prescription taught. Start at a low level workload and increase workload after able to maintain previous level for 30 minutes. Increase time before increasing intensity.   Intervention Provide exercise education and an individualized exercise prescription that will provide continued progressive overload as per policy without signs/symptoms of physical distress.   Take Less Medication Yes   Intervention Learn your risk factors and begin the lifestyle modifications for risk factor control during your time in the program.   Hypertension Yes   Goal Participant will see blood pressure controlled within the values of 140/83m/Hg or within value directed by their physician.   Intervention Provide nutrition & aerobic exercise along with prescribed medications to achieve BP 140/90 or less.   Lipids Yes   Goal Cholesterol controlled with medications as prescribed, with individualized exercise RX and with  personalized nutrition plan. Value goals: LDL < 745m HDL > 4088mParticipant states understanding of desired cholesterol values and following prescriptions.   Intervention Provide nutrition & aerobic exercise along with prescribed medications to achieve LDL <86m76mDL >40mg36m   Personal Goals and Risk Factors Review:      Goals and Risk Factor Review      01/24/15 1018           Increase Aerobic Exercise and Physical Activity   Goals Progress/Improvement seen  Yes       Comments JL has found that he really enjoys the BioStep machine and he can feel his legs getting stronger on it. He is aleady up to level 5 on this machine and his endurance is also improving as he can now go for 20 minutes without needing a break. JL said he is more confident in his walking abilities and he can now walk faster on the treadmill at 3.2 mph.           Personal Goals Discharge (Final Personal Goals and Risk Factors Review):      Goals and Risk Factor Review - 01/24/15 1018    Increase Aerobic Exercise and Physical Activity   Goals Progress/Improvement  seen  Yes   Comments JL has found that he really enjoys the BioStep machine and he can feel his legs getting stronger on it. He is aleady up to level 5 on this machine and his endurance is also improving as he can now go for 20 minutes without needing a break. JL said he is more confident in his walking abilities and he can now walk faster on the treadmill at 3.2 mph.       ITP Comments:     ITP Comments      01/11/15 0901 01/17/15 1636 03/08/2015 1353       ITP Comments Initial ITP   New start with 2 visits Patient experienced [employer] Cardiac Arrest during relaxation phase of session on 2015-03-08.  Transported to ER after emergency care provided by staff memebers and [employer] team. JL did not survive the Cardiac Arrest event.         Comments: JL experienced Cardiac Arrest and did not survive the event.

## 2015-02-13 NOTE — Code Documentation (Signed)
Pulse check, CPR resumed. 2nd set of defibrillation pads attached to pt's chest. CPR resumed.

## 2015-02-13 DEATH — deceased

## 2015-03-31 ENCOUNTER — Ambulatory Visit: Payer: Medicare Other | Admitting: Family Medicine

## 2016-09-29 IMAGING — NM NM BONE WHOLE BODY
2 series · 2 of 2 positions shown · non-contrast
Comparison: No prior.

CLINICAL DATA: Prostate cancer.

EXAM:
NUCLEAR MEDICINE WHOLE BODY BONE SCAN
TECHNIQUE: Whole body anterior and posterior images were obtained approximately
3 hours after intravenous injection of radiopharmaceutical.
RADIOPHARMACEUTICALS:  26.6 mCi Pechnetium-RR MDP

[Series 1: whole body · 2.66mm/px · 1 of 1 slices shown (1 of 2)]
[im 1/1]
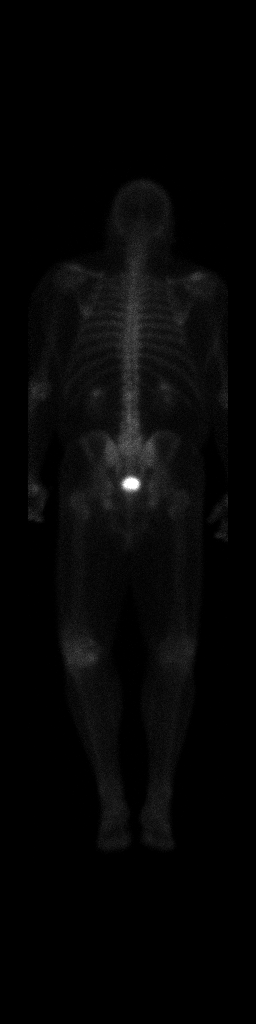

[Series 1: whole body · 2.66mm/px · 1 of 1 slices shown (2 of 2)]
[im 1/1]
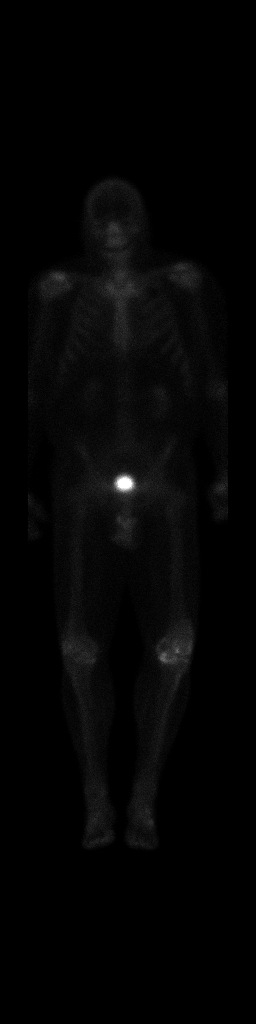

[2 of 2 positions shown; findings below may reference images not displayed]

FINDINGS: Bilateral renal function excretion. Mild increase activity noted
about the shoulders and knees most likely degenerative. No other
focal abnormality identified. No evidence of metastatic disease.
IMPRESSION: Mild increase scratch activity noted about the shoulders and knees
most likely degenerative. No evidence of metastatic disease .

## 2017-07-18 IMAGING — CR DG CHEST 1V PORT
1 series · 1 of 1 positions shown · non-contrast
Comparison: Chest radiograph 03/15/2010.

CLINICAL DATA: Patient coded in cardiac rehab.  Post intubation.

EXAM:
PORTABLE CHEST 1 VIEW

[ap]
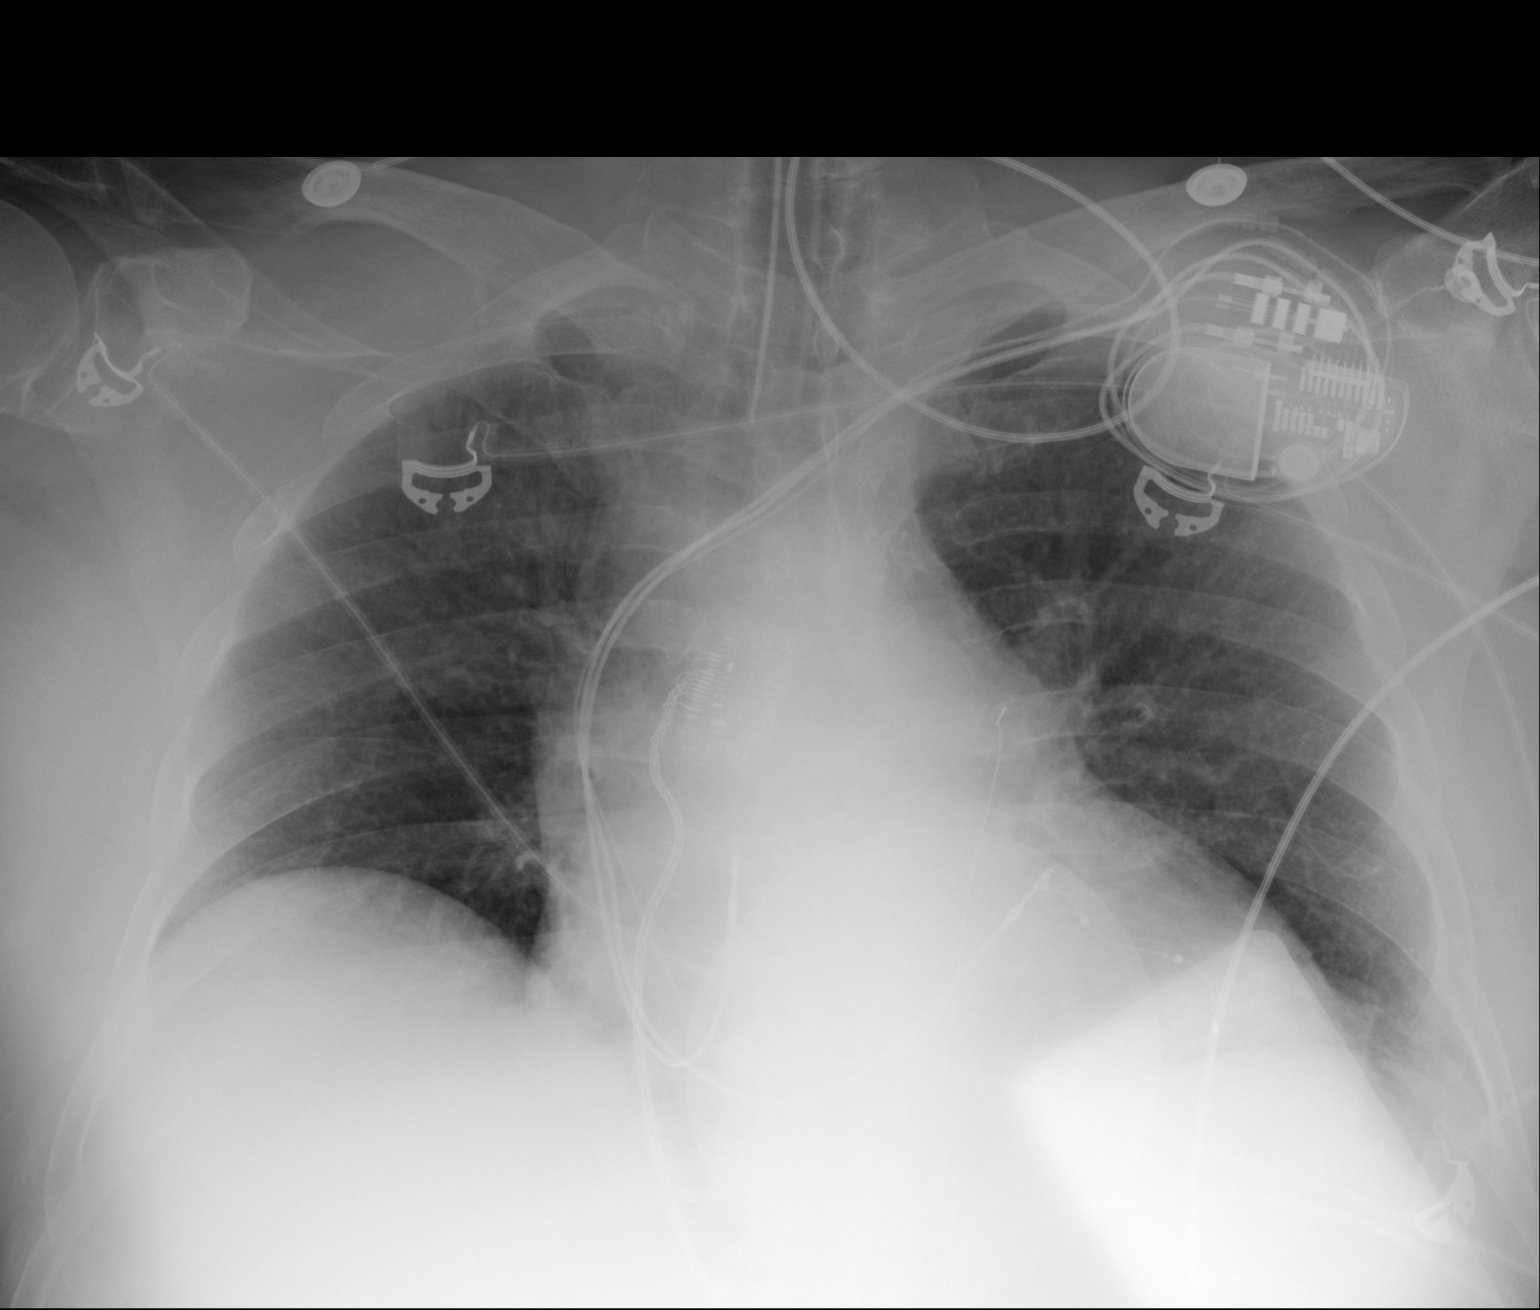

[1 of 1 positions shown; findings below may reference images not displayed]

FINDINGS: ET tube 4.2 cm above carina. Cardiomegaly. Low lung volumes. No
infiltrates or overt failure. Dual lead pacer. Worsening aeration
from priors.
IMPRESSION: ET tube satisfactory position. Cardiomegaly. No acute
cardiopulmonary disease.
